# Patient Record
Sex: Female | Born: 1935 | Race: White | Hispanic: No | Smoking: Never smoker
Health system: Southern US, Community
[De-identification: ages and names within clinical notes are randomized; demographics above are authoritative.]

## PROBLEM LIST (undated history)

## (undated) DIAGNOSIS — G510 Bell's palsy: Secondary | ICD-10-CM

## (undated) DIAGNOSIS — G839 Paralytic syndrome, unspecified: Secondary | ICD-10-CM

## (undated) DIAGNOSIS — F419 Anxiety disorder, unspecified: Secondary | ICD-10-CM

## (undated) DIAGNOSIS — I10 Essential (primary) hypertension: Secondary | ICD-10-CM

## (undated) DIAGNOSIS — C8310 Mantle cell lymphoma, unspecified site: Secondary | ICD-10-CM

## (undated) DIAGNOSIS — E785 Hyperlipidemia, unspecified: Secondary | ICD-10-CM

## (undated) DIAGNOSIS — E119 Type 2 diabetes mellitus without complications: Secondary | ICD-10-CM

## (undated) DIAGNOSIS — T7840XA Allergy, unspecified, initial encounter: Secondary | ICD-10-CM

## (undated) DIAGNOSIS — C801 Malignant (primary) neoplasm, unspecified: Secondary | ICD-10-CM

## (undated) HISTORY — PX: APPENDECTOMY: SHX54

## (undated) HISTORY — PX: VESICOVAGINAL FISTULA CLOSURE W/ TAH: SUR271

## (undated) HISTORY — DX: Essential (primary) hypertension: I10

## (undated) HISTORY — PX: ABDOMINAL HYSTERECTOMY: SHX81

## (undated) HISTORY — DX: Allergy, unspecified, initial encounter: T78.40XA

---

## 2004-09-04 ENCOUNTER — Ambulatory Visit: Payer: Self-pay | Admitting: Internal Medicine

## 2012-01-11 ENCOUNTER — Ambulatory Visit: Payer: Self-pay

## 2012-04-25 ENCOUNTER — Ambulatory Visit: Payer: Self-pay | Admitting: Internal Medicine

## 2012-04-25 LAB — RAPID STREP-A WITH REFLX: Micro Text Report: NEGATIVE

## 2012-04-27 LAB — BETA STREP CULTURE(ARMC)

## 2012-07-05 ENCOUNTER — Ambulatory Visit: Payer: Self-pay | Admitting: Family Medicine

## 2012-08-13 ENCOUNTER — Ambulatory Visit: Payer: Self-pay | Admitting: Internal Medicine

## 2012-12-26 ENCOUNTER — Ambulatory Visit: Payer: Self-pay | Admitting: Family Medicine

## 2012-12-26 LAB — COMPREHENSIVE METABOLIC PANEL
Albumin: 3.8 g/dL (ref 3.4–5.0)
Alkaline Phosphatase: 92 U/L (ref 50–136)
BUN: 14 mg/dL (ref 7–18)
Bilirubin,Total: 0.5 mg/dL (ref 0.2–1.0)
Calcium, Total: 9.4 mg/dL (ref 8.5–10.1)
Chloride: 100 mmol/L (ref 98–107)
Co2: 30 mmol/L (ref 21–32)
Creatinine: 0.76 mg/dL (ref 0.60–1.30)
EGFR (Non-African Amer.): >60
Osmolality: 279 (ref 275–301)
Potassium: 4.7 mmol/L (ref 3.5–5.1)
SGOT(AST): 19 U/L (ref 15–37)

## 2012-12-26 LAB — CBC WITH DIFFERENTIAL/PLATELET
Eosinophil %: 0.5 %
HCT: 39.8 % (ref 35.0–47.0)
HGB: 13.4 g/dL (ref 12.0–16.0)
Lymphocyte #: 1.4 10*3/uL (ref 1.0–3.6)
Lymphocyte %: 13.7 %
MCHC: 33.8 g/dL (ref 32.0–36.0)
MCV: 90 fL (ref 80–100)
Monocyte #: 0.3 x10 3/mm (ref 0.2–0.9)
Neutrophil %: 82 %
Platelet: 203 10*3/uL (ref 150–440)
RBC: 4.44 10*6/uL (ref 3.80–5.20)
RDW: 12.6 % (ref 11.5–14.5)
WBC: 9.9 10*3/uL (ref 3.6–11.0)

## 2012-12-26 LAB — URINALYSIS, COMPLETE
Leukocyte Esterase: NEGATIVE
Ph: 7 (ref 4.5–8.0)
Protein: NEGATIVE
Specific Gravity: 1.005 (ref 1.003–1.030)
Squamous Epithelial: NONE SEEN

## 2012-12-26 LAB — TSH: Thyroid Stimulating Horm: 1.39 u[IU]/mL

## 2012-12-26 LAB — RAPID STREP-A WITH REFLX: Micro Text Report: POSITIVE

## 2013-07-19 ENCOUNTER — Ambulatory Visit: Payer: Self-pay | Admitting: Internal Medicine

## 2013-07-19 LAB — RAPID STREP-A WITH REFLX: Micro Text Report: NEGATIVE

## 2013-07-21 LAB — BETA STREP CULTURE(ARMC)

## 2013-12-17 ENCOUNTER — Ambulatory Visit: Payer: Self-pay

## 2013-12-17 LAB — RAPID STREP-A WITH REFLX: Micro Text Report: NEGATIVE

## 2013-12-20 LAB — BETA STREP CULTURE(ARMC)

## 2014-01-10 DIAGNOSIS — F419 Anxiety disorder, unspecified: Secondary | ICD-10-CM | POA: Insufficient documentation

## 2014-02-20 ENCOUNTER — Ambulatory Visit: Payer: Self-pay | Admitting: Family Medicine

## 2014-02-26 ENCOUNTER — Ambulatory Visit: Payer: Self-pay | Admitting: Family Medicine

## 2014-02-26 LAB — BASIC METABOLIC PANEL
Anion Gap: 7 (ref 7–16)
BUN: 23 mg/dL — ABNORMAL HIGH (ref 7–18)
CALCIUM: 9.6 mg/dL (ref 8.5–10.1)
CO2: 30 mmol/L (ref 21–32)
CREATININE: 0.88 mg/dL (ref 0.60–1.30)
Chloride: 97 mmol/L — ABNORMAL LOW (ref 98–107)
EGFR (African American): >60
EGFR (Non-African Amer.): >60
GLUCOSE: 154 mg/dL — AB (ref 65–99)
Osmolality: 275 (ref 275–301)
POTASSIUM: 4.3 mmol/L (ref 3.5–5.1)
SODIUM: 134 mmol/L — AB (ref 136–145)

## 2014-02-26 LAB — CBC WITH DIFFERENTIAL/PLATELET
Basophil #: 0.1 x10 3/mm 3 (ref 0.0–0.1)
Basophil %: 0.5 %
Eosinophil #: 0.1 x10 3/mm 3 (ref 0.0–0.7)
Eosinophil %: 1.3 %
HCT: 36.5 % (ref 35.0–47.0)
HGB: 11.9 g/dL — ABNORMAL LOW (ref 12.0–16.0)
Lymphocyte %: 32.7 %
Lymphs Abs: 3.6 x10 3/mm 3 (ref 1.0–3.6)
MCH: 29.4 pg (ref 26.0–34.0)
MCHC: 32.6 g/dL (ref 32.0–36.0)
MCV: 90 fL (ref 80–100)
Monocyte #: 0.7 x10 3/mm (ref 0.2–0.9)
Monocyte %: 6.1 %
Neutrophil #: 6.5 x10 3/mm 3 (ref 1.4–6.5)
Neutrophil %: 59.4 %
Platelet: 248 x10 3/mm 3 (ref 150–440)
RBC: 4.05 X10 6/mm 3 (ref 3.80–5.20)
RDW: 13.8 % (ref 11.5–14.5)
WBC: 10.9 x10 3/mm 3 (ref 3.6–11.0)

## 2014-02-27 ENCOUNTER — Ambulatory Visit: Payer: Self-pay | Admitting: Family Medicine

## 2014-02-27 LAB — TSH: THYROID STIMULATING HORM: 91.8 u[IU]/mL — AB

## 2014-03-01 ENCOUNTER — Ambulatory Visit: Payer: Self-pay | Admitting: Cardiothoracic Surgery

## 2014-03-01 ENCOUNTER — Ambulatory Visit: Payer: Self-pay | Admitting: Radiation Oncology

## 2014-03-01 LAB — HEPATIC FUNCTION PANEL A (ARMC)
ALBUMIN: 2.8 g/dL — AB (ref 3.4–5.0)
Alkaline Phosphatase: 121 U/L — ABNORMAL HIGH
Bilirubin,Total: 0.4 mg/dL (ref 0.2–1.0)
SGOT(AST): 73 U/L — ABNORMAL HIGH (ref 15–37)
SGPT (ALT): 36 U/L
Total Protein: 8 g/dL (ref 6.4–8.2)

## 2014-03-01 LAB — APTT: Activated PTT: 27.9 secs (ref 23.6–35.9)

## 2014-03-01 LAB — PROTIME-INR
INR: 1
PROTHROMBIN TIME: 13 s (ref 11.5–14.7)

## 2014-03-06 ENCOUNTER — Ambulatory Visit: Payer: Self-pay

## 2014-03-12 ENCOUNTER — Ambulatory Visit: Payer: Self-pay

## 2014-03-15 LAB — PATHOLOGY REPORT

## 2014-03-19 ENCOUNTER — Ambulatory Visit: Payer: Self-pay | Admitting: Oncology

## 2014-03-20 ENCOUNTER — Ambulatory Visit: Payer: Self-pay | Admitting: Radiation Oncology

## 2014-03-20 ENCOUNTER — Ambulatory Visit: Payer: Self-pay | Admitting: Oncology

## 2014-03-31 ENCOUNTER — Ambulatory Visit: Payer: Self-pay | Admitting: Internal Medicine

## 2014-04-17 LAB — CBC CANCER CENTER
BASOS ABS: 0 x10 3/mm (ref 0.0–0.1)
Basophil %: 0.3 %
Eosinophil #: 0 x10 3/mm (ref 0.0–0.7)
Eosinophil %: 0.4 %
HCT: 33.2 % — ABNORMAL LOW (ref 35.0–47.0)
HGB: 10.8 g/dL — ABNORMAL LOW (ref 12.0–16.0)
Lymphocyte #: 2.8 x10 3/mm (ref 1.0–3.6)
Lymphocyte %: 22.5 %
MCH: 29 pg (ref 26.0–34.0)
MCHC: 32.7 g/dL (ref 32.0–36.0)
MCV: 89 fL (ref 80–100)
MONO ABS: 0.7 x10 3/mm (ref 0.2–0.9)
Monocyte %: 6 %
NEUTROS ABS: 8.8 x10 3/mm — AB (ref 1.4–6.5)
Neutrophil %: 70.8 %
PLATELETS: 375 x10 3/mm (ref 150–440)
RBC: 3.74 10*6/uL — ABNORMAL LOW (ref 3.80–5.20)
RDW: 14.4 % (ref 11.5–14.5)
WBC: 12.4 x10 3/mm — ABNORMAL HIGH (ref 3.6–11.0)

## 2014-04-19 ENCOUNTER — Ambulatory Visit: Payer: Self-pay | Admitting: Oncology

## 2014-04-19 ENCOUNTER — Ambulatory Visit: Payer: Self-pay | Admitting: Radiation Oncology

## 2014-05-08 LAB — COMPREHENSIVE METABOLIC PANEL
Albumin: 2.3 g/dL — ABNORMAL LOW (ref 3.4–5.0)
Alkaline Phosphatase: 169 U/L — ABNORMAL HIGH
Anion Gap: 7 (ref 7–16)
BILIRUBIN TOTAL: 0.3 mg/dL (ref 0.2–1.0)
BUN: 13 mg/dL (ref 7–18)
CREATININE: 0.84 mg/dL (ref 0.60–1.30)
Calcium, Total: 8.8 mg/dL (ref 8.5–10.1)
Chloride: 99 mmol/L (ref 98–107)
Co2: 29 mmol/L (ref 21–32)
EGFR (African American): >60
EGFR (Non-African Amer.): >60
GLUCOSE: 219 mg/dL — AB (ref 65–99)
OSMOLALITY: 277 (ref 275–301)
POTASSIUM: 3.1 mmol/L — AB (ref 3.5–5.1)
SGOT(AST): 24 U/L (ref 15–37)
SGPT (ALT): 22 U/L
Sodium: 135 mmol/L — ABNORMAL LOW (ref 136–145)
Total Protein: 6.9 g/dL (ref 6.4–8.2)

## 2014-05-08 LAB — CBC CANCER CENTER
BASOS PCT: 0.6 %
Basophil #: 0.1 x10 3/mm (ref 0.0–0.1)
EOS ABS: 0.1 x10 3/mm (ref 0.0–0.7)
Eosinophil %: 0.7 %
HCT: 33.4 % — ABNORMAL LOW (ref 35.0–47.0)
HGB: 10.6 g/dL — AB (ref 12.0–16.0)
LYMPHS PCT: 36.5 %
Lymphocyte #: 4.3 x10 3/mm — ABNORMAL HIGH (ref 1.0–3.6)
MCH: 27.9 pg (ref 26.0–34.0)
MCHC: 31.7 g/dL — ABNORMAL LOW (ref 32.0–36.0)
MCV: 88 fL (ref 80–100)
MONOS PCT: 5.3 %
Monocyte #: 0.6 x10 3/mm (ref 0.2–0.9)
NEUTROS ABS: 6.7 x10 3/mm — AB (ref 1.4–6.5)
NEUTROS PCT: 56.9 %
Platelet: 280 x10 3/mm (ref 150–440)
RBC: 3.81 10*6/uL (ref 3.80–5.20)
RDW: 14 % (ref 11.5–14.5)
WBC: 11.9 x10 3/mm — ABNORMAL HIGH (ref 3.6–11.0)

## 2014-05-17 LAB — CBC CANCER CENTER
Basophil #: 0 x10 3/mm (ref 0.0–0.1)
Basophil %: 0.2 %
Eosinophil #: 0 x10 3/mm (ref 0.0–0.7)
Eosinophil %: 0.8 %
HCT: 32.4 % — ABNORMAL LOW (ref 35.0–47.0)
HGB: 10.5 g/dL — AB (ref 12.0–16.0)
Lymphocyte #: 0.6 x10 3/mm — ABNORMAL LOW (ref 1.0–3.6)
Lymphocyte %: 9.5 %
MCH: 28.2 pg (ref 26.0–34.0)
MCHC: 32.3 g/dL (ref 32.0–36.0)
MCV: 87 fL (ref 80–100)
Monocyte #: 0.5 x10 3/mm (ref 0.2–0.9)
Monocyte %: 8.1 %
Neutrophil #: 5.2 x10 3/mm (ref 1.4–6.5)
Neutrophil %: 81.4 %
Platelet: 415 x10 3/mm (ref 150–440)
RBC: 3.72 10*6/uL — ABNORMAL LOW (ref 3.80–5.20)
RDW: 14.3 % (ref 11.5–14.5)
WBC: 6.4 x10 3/mm (ref 3.6–11.0)

## 2014-05-17 LAB — COMPREHENSIVE METABOLIC PANEL
AST: 15 U/L (ref 15–37)
Albumin: 3 g/dL — ABNORMAL LOW (ref 3.4–5.0)
Alkaline Phosphatase: 156 U/L — ABNORMAL HIGH
Anion Gap: 10 (ref 7–16)
BILIRUBIN TOTAL: 0.4 mg/dL (ref 0.2–1.0)
BUN: 10 mg/dL (ref 7–18)
CALCIUM: 9.3 mg/dL (ref 8.5–10.1)
CHLORIDE: 94 mmol/L — AB (ref 98–107)
Co2: 27 mmol/L (ref 21–32)
Creatinine: 0.64 mg/dL (ref 0.60–1.30)
EGFR (Non-African Amer.): >60
Glucose: 220 mg/dL — ABNORMAL HIGH (ref 65–99)
Osmolality: 268 (ref 275–301)
Potassium: 3.7 mmol/L (ref 3.5–5.1)
SGPT (ALT): 21 U/L
SODIUM: 131 mmol/L — AB (ref 136–145)
TOTAL PROTEIN: 7.4 g/dL (ref 6.4–8.2)

## 2014-05-17 LAB — LACTATE DEHYDROGENASE: LDH: 176 U/L (ref 81–246)

## 2014-05-20 ENCOUNTER — Ambulatory Visit: Payer: Self-pay | Admitting: Radiation Oncology

## 2014-05-20 ENCOUNTER — Ambulatory Visit: Payer: Self-pay | Admitting: Oncology

## 2014-05-31 LAB — CBC CANCER CENTER
BASOS ABS: 0 x10 3/mm (ref 0.0–0.1)
Basophil %: 0.6 %
EOS ABS: 0.3 x10 3/mm (ref 0.0–0.7)
Eosinophil %: 5.9 %
HCT: 32.5 % — ABNORMAL LOW (ref 35.0–47.0)
HGB: 10.8 g/dL — AB (ref 12.0–16.0)
LYMPHS PCT: 15.3 %
Lymphocyte #: 0.9 x10 3/mm — ABNORMAL LOW (ref 1.0–3.6)
MCH: 28.4 pg (ref 26.0–34.0)
MCHC: 33.3 g/dL (ref 32.0–36.0)
MCV: 85 fL (ref 80–100)
MONOS PCT: 11.5 %
Monocyte #: 0.7 x10 3/mm (ref 0.2–0.9)
NEUTROS ABS: 3.8 x10 3/mm (ref 1.4–6.5)
Neutrophil %: 66.7 %
Platelet: 234 x10 3/mm (ref 150–440)
RBC: 3.81 10*6/uL (ref 3.80–5.20)
RDW: 15.5 % — ABNORMAL HIGH (ref 11.5–14.5)
WBC: 5.7 x10 3/mm (ref 3.6–11.0)

## 2014-05-31 LAB — COMPREHENSIVE METABOLIC PANEL
ALBUMIN: 3.4 g/dL (ref 3.4–5.0)
ALK PHOS: 141 U/L — AB
ALT: 21 U/L
ANION GAP: 11 (ref 7–16)
BUN: 19 mg/dL — AB (ref 7–18)
Bilirubin,Total: 0.3 mg/dL (ref 0.2–1.0)
CHLORIDE: 97 mmol/L — AB (ref 98–107)
Calcium, Total: 9.6 mg/dL (ref 8.5–10.1)
Co2: 28 mmol/L (ref 21–32)
Creatinine: 0.63 mg/dL (ref 0.60–1.30)
EGFR (African American): >60
Glucose: 146 mg/dL — ABNORMAL HIGH (ref 65–99)
Osmolality: 277 (ref 275–301)
Potassium: 4.3 mmol/L (ref 3.5–5.1)
SGOT(AST): 19 U/L (ref 15–37)
Sodium: 136 mmol/L (ref 136–145)
Total Protein: 7.9 g/dL (ref 6.4–8.2)

## 2014-05-31 LAB — LACTATE DEHYDROGENASE: LDH: 169 U/L (ref 81–246)

## 2014-06-07 LAB — CBC CANCER CENTER
Basophil #: 0 x10 3/mm (ref 0.0–0.1)
Basophil %: 0.2 %
Eosinophil #: 0.5 x10 3/mm (ref 0.0–0.7)
Eosinophil %: 9.7 %
HCT: 29.4 % — AB (ref 35.0–47.0)
HGB: 9.6 g/dL — ABNORMAL LOW (ref 12.0–16.0)
Lymphocyte #: 0.2 x10 3/mm — ABNORMAL LOW (ref 1.0–3.6)
Lymphocyte %: 4.8 %
MCH: 28.6 pg (ref 26.0–34.0)
MCHC: 32.6 g/dL (ref 32.0–36.0)
MCV: 88 fL (ref 80–100)
Monocyte #: 0.4 x10 3/mm (ref 0.2–0.9)
Monocyte %: 8.8 %
NEUTROS ABS: 3.9 x10 3/mm (ref 1.4–6.5)
Neutrophil %: 76.5 %
Platelet: 164 x10 3/mm (ref 150–440)
RBC: 3.35 10*6/uL — ABNORMAL LOW (ref 3.80–5.20)
RDW: 15.4 % — ABNORMAL HIGH (ref 11.5–14.5)
WBC: 5.1 x10 3/mm (ref 3.6–11.0)

## 2014-06-19 ENCOUNTER — Ambulatory Visit: Payer: Self-pay | Admitting: Oncology

## 2014-06-21 LAB — CBC CANCER CENTER
BASOS ABS: 0 x10 3/mm (ref 0.0–0.1)
Basophil %: 0.4 %
EOS ABS: 1.1 x10 3/mm — AB (ref 0.0–0.7)
EOS PCT: 25.5 %
HCT: 31.1 % — ABNORMAL LOW (ref 35.0–47.0)
HGB: 10.6 g/dL — AB (ref 12.0–16.0)
Lymphocyte #: 0.3 x10 3/mm — ABNORMAL LOW (ref 1.0–3.6)
Lymphocyte %: 7.5 %
MCH: 29.1 pg (ref 26.0–34.0)
MCHC: 34 g/dL (ref 32.0–36.0)
MCV: 86 fL (ref 80–100)
Monocyte #: 0.5 x10 3/mm (ref 0.2–0.9)
Monocyte %: 11.3 %
NEUTROS ABS: 2.3 x10 3/mm (ref 1.4–6.5)
Neutrophil %: 55.3 %
PLATELETS: 251 x10 3/mm (ref 150–440)
RBC: 3.63 10*6/uL — AB (ref 3.80–5.20)
RDW: 17.1 % — ABNORMAL HIGH (ref 11.5–14.5)
WBC: 4.2 x10 3/mm (ref 3.6–11.0)

## 2014-06-21 LAB — COMPREHENSIVE METABOLIC PANEL
ALT: 20 U/L
Albumin: 3.5 g/dL (ref 3.4–5.0)
Alkaline Phosphatase: 141 U/L — ABNORMAL HIGH
Anion Gap: 8 (ref 7–16)
BUN: 14 mg/dL (ref 7–18)
Bilirubin,Total: 0.3 mg/dL (ref 0.2–1.0)
CALCIUM: 9.7 mg/dL (ref 8.5–10.1)
CHLORIDE: 98 mmol/L (ref 98–107)
Co2: 31 mmol/L (ref 21–32)
Creatinine: 0.69 mg/dL (ref 0.60–1.30)
EGFR (African American): >60
EGFR (Non-African Amer.): >60
GLUCOSE: 145 mg/dL — AB (ref 65–99)
Osmolality: 277 (ref 275–301)
Potassium: 3.7 mmol/L (ref 3.5–5.1)
SGOT(AST): 15 U/L (ref 15–37)
Sodium: 137 mmol/L (ref 136–145)
Total Protein: 7.5 g/dL (ref 6.4–8.2)

## 2014-06-21 LAB — LACTATE DEHYDROGENASE: LDH: 167 U/L (ref 81–246)

## 2014-06-28 LAB — CBC CANCER CENTER
Basophil #: 0 x10 3/mm (ref 0.0–0.1)
Basophil %: 0.4 %
EOS PCT: 21.2 %
Eosinophil #: 0.6 x10 3/mm (ref 0.0–0.7)
HCT: 29.4 % — AB (ref 35.0–47.0)
HGB: 9.9 g/dL — AB (ref 12.0–16.0)
LYMPHS ABS: 0.3 x10 3/mm — AB (ref 1.0–3.6)
Lymphocyte %: 9.1 %
MCH: 29.1 pg (ref 26.0–34.0)
MCHC: 33.5 g/dL (ref 32.0–36.0)
MCV: 87 fL (ref 80–100)
MONO ABS: 0.5 x10 3/mm (ref 0.2–0.9)
Monocyte %: 15.5 %
NEUTROS PCT: 53.8 %
Neutrophil #: 1.6 x10 3/mm (ref 1.4–6.5)
Platelet: 249 x10 3/mm (ref 150–440)
RBC: 3.39 10*6/uL — AB (ref 3.80–5.20)
RDW: 17.2 % — ABNORMAL HIGH (ref 11.5–14.5)
WBC: 3 x10 3/mm — ABNORMAL LOW (ref 3.6–11.0)

## 2014-07-17 LAB — CBC CANCER CENTER
BASOS ABS: 0 x10 3/mm (ref 0.0–0.1)
BASOS PCT: 0.4 %
Eosinophil #: 0.5 x10 3/mm (ref 0.0–0.7)
Eosinophil %: 12.6 %
HCT: 32.7 % — ABNORMAL LOW (ref 35.0–47.0)
HGB: 10.9 g/dL — AB (ref 12.0–16.0)
LYMPHS PCT: 18.3 %
Lymphocyte #: 0.7 x10 3/mm — ABNORMAL LOW (ref 1.0–3.6)
MCH: 29.1 pg (ref 26.0–34.0)
MCHC: 33.3 g/dL (ref 32.0–36.0)
MCV: 87 fL (ref 80–100)
MONO ABS: 0.8 x10 3/mm (ref 0.2–0.9)
MONOS PCT: 21.3 %
NEUTROS ABS: 1.8 x10 3/mm (ref 1.4–6.5)
Neutrophil %: 47.4 %
PLATELETS: 216 x10 3/mm (ref 150–440)
RBC: 3.75 10*6/uL — AB (ref 3.80–5.20)
RDW: 17.2 % — ABNORMAL HIGH (ref 11.5–14.5)
WBC: 3.8 x10 3/mm (ref 3.6–11.0)

## 2014-07-17 LAB — COMPREHENSIVE METABOLIC PANEL
Albumin: 3.7 g/dL (ref 3.4–5.0)
Alkaline Phosphatase: 130 U/L — ABNORMAL HIGH
Anion Gap: 8 (ref 7–16)
BILIRUBIN TOTAL: 0.2 mg/dL (ref 0.2–1.0)
BUN: 16 mg/dL (ref 7–18)
CHLORIDE: 98 mmol/L (ref 98–107)
CREATININE: 0.79 mg/dL (ref 0.60–1.30)
Calcium, Total: 9.7 mg/dL (ref 8.5–10.1)
Co2: 30 mmol/L (ref 21–32)
EGFR (African American): >60
EGFR (Non-African Amer.): >60
Glucose: 154 mg/dL — ABNORMAL HIGH (ref 65–99)
OSMOLALITY: 276 (ref 275–301)
Potassium: 4.6 mmol/L (ref 3.5–5.1)
SGOT(AST): 17 U/L (ref 15–37)
SGPT (ALT): 20 U/L
SODIUM: 136 mmol/L (ref 136–145)
Total Protein: 7.5 g/dL (ref 6.4–8.2)

## 2014-07-17 LAB — LACTATE DEHYDROGENASE: LDH: 176 U/L (ref 81–246)

## 2014-07-20 ENCOUNTER — Ambulatory Visit: Payer: Self-pay | Admitting: Oncology

## 2014-07-20 ENCOUNTER — Ambulatory Visit: Payer: Self-pay | Admitting: Radiation Oncology

## 2014-07-25 LAB — CBC CANCER CENTER
BASOS ABS: 0 x10 3/mm (ref 0.0–0.1)
BASOS PCT: 0.2 %
Eosinophil #: 0.5 x10 3/mm (ref 0.0–0.7)
Eosinophil %: 8.2 %
HCT: 32.4 % — AB (ref 35.0–47.0)
HGB: 11.2 g/dL — AB (ref 12.0–16.0)
LYMPHS ABS: 0.1 x10 3/mm — AB (ref 1.0–3.6)
LYMPHS PCT: 2.3 %
MCH: 29.9 pg (ref 26.0–34.0)
MCHC: 34.5 g/dL (ref 32.0–36.0)
MCV: 87 fL (ref 80–100)
MONOS PCT: 12.8 %
Monocyte #: 0.7 x10 3/mm (ref 0.2–0.9)
NEUTROS ABS: 4.4 x10 3/mm (ref 1.4–6.5)
Neutrophil %: 76.5 %
Platelet: 204 x10 3/mm (ref 150–440)
RBC: 3.74 10*6/uL — AB (ref 3.80–5.20)
RDW: 16.9 % — ABNORMAL HIGH (ref 11.5–14.5)
WBC: 5.7 x10 3/mm (ref 3.6–11.0)

## 2014-08-06 ENCOUNTER — Ambulatory Visit: Payer: Self-pay | Admitting: Oncology

## 2014-08-14 LAB — CBC CANCER CENTER
BASOS ABS: 0 x10 3/mm (ref 0.0–0.1)
Basophil %: 0.4 %
EOS ABS: 0.3 x10 3/mm (ref 0.0–0.7)
Eosinophil %: 9.1 %
HCT: 33 % — ABNORMAL LOW (ref 35.0–47.0)
HGB: 11.4 g/dL — ABNORMAL LOW (ref 12.0–16.0)
LYMPHS PCT: 13.6 %
Lymphocyte #: 0.5 x10 3/mm — ABNORMAL LOW (ref 1.0–3.6)
MCH: 30.5 pg (ref 26.0–34.0)
MCHC: 34.7 g/dL (ref 32.0–36.0)
MCV: 88 fL (ref 80–100)
MONO ABS: 0.5 x10 3/mm (ref 0.2–0.9)
Monocyte %: 14.7 %
NEUTROS ABS: 2.2 x10 3/mm (ref 1.4–6.5)
Neutrophil %: 62.2 %
Platelet: 211 x10 3/mm (ref 150–440)
RBC: 3.75 10*6/uL — ABNORMAL LOW (ref 3.80–5.20)
RDW: 16.3 % — AB (ref 11.5–14.5)
WBC: 3.5 x10 3/mm — AB (ref 3.6–11.0)

## 2014-08-14 LAB — COMPREHENSIVE METABOLIC PANEL
ALT: 23 U/L (ref 14–63)
ANION GAP: 9 (ref 7–16)
Albumin: 3.8 g/dL (ref 3.4–5.0)
Alkaline Phosphatase: 131 U/L — ABNORMAL HIGH (ref 46–116)
BUN: 27 mg/dL — ABNORMAL HIGH (ref 7–18)
Bilirubin,Total: 0.3 mg/dL (ref 0.2–1.0)
CHLORIDE: 99 mmol/L (ref 98–107)
CO2: 30 mmol/L (ref 21–32)
Calcium, Total: 9.4 mg/dL (ref 8.5–10.1)
Creatinine: 0.83 mg/dL (ref 0.60–1.30)
EGFR (African American): >60
EGFR (Non-African Amer.): >60
GLUCOSE: 123 mg/dL — AB (ref 65–99)
Osmolality: 282 (ref 275–301)
POTASSIUM: 3.9 mmol/L (ref 3.5–5.1)
SGOT(AST): 19 U/L (ref 15–37)
Sodium: 138 mmol/L (ref 136–145)
TOTAL PROTEIN: 7.5 g/dL (ref 6.4–8.2)

## 2014-08-14 LAB — LACTATE DEHYDROGENASE: LDH: 180 U/L (ref 81–246)

## 2014-08-20 ENCOUNTER — Ambulatory Visit: Payer: Self-pay | Admitting: Oncology

## 2014-08-20 ENCOUNTER — Ambulatory Visit: Payer: Self-pay | Admitting: Radiation Oncology

## 2014-09-18 ENCOUNTER — Ambulatory Visit
Admit: 2014-09-18 | Disposition: A | Discharge status: Home / Self Care | Payer: Self-pay | Attending: Oncology | Admitting: Oncology

## 2014-10-15 ENCOUNTER — Ambulatory Visit: Admit: 2014-10-15 | Disposition: A | Discharge status: Home / Self Care | Payer: Self-pay | Admitting: Oncology

## 2014-10-17 LAB — CBC CANCER CENTER
BASOS PCT: 0.3 %
Basophil #: 0 x10 3/mm (ref 0.0–0.1)
Eosinophil #: 0.4 x10 3/mm (ref 0.0–0.7)
Eosinophil %: 12 %
HCT: 31.1 % — AB (ref 35.0–47.0)
HGB: 10.8 g/dL — AB (ref 12.0–16.0)
Lymphocyte #: 0.3 x10 3/mm — ABNORMAL LOW (ref 1.0–3.6)
Lymphocyte %: 7.9 %
MCH: 32.1 pg (ref 26.0–34.0)
MCHC: 34.9 g/dL (ref 32.0–36.0)
MCV: 92 fL (ref 80–100)
Monocyte #: 0.4 x10 3/mm (ref 0.2–0.9)
Monocyte %: 13 %
Neutrophil #: 2.2 x10 3/mm (ref 1.4–6.5)
Neutrophil %: 66.8 %
Platelet: 210 x10 3/mm (ref 150–440)
RBC: 3.37 10*6/uL — ABNORMAL LOW (ref 3.80–5.20)
RDW: 14.4 % (ref 11.5–14.5)
WBC: 3.3 x10 3/mm — ABNORMAL LOW (ref 3.6–11.0)

## 2014-10-17 LAB — COMPREHENSIVE METABOLIC PANEL
ALBUMIN: 4.4 g/dL
ALK PHOS: 99 U/L
ALT: 15 U/L
AST: 25 U/L
Anion Gap: 9 (ref 7–16)
BUN: 16 mg/dL
Bilirubin,Total: 0.3 mg/dL
CALCIUM: 9.8 mg/dL
CO2: 28 mmol/L
Chloride: 101 mmol/L
Creatinine: 0.56 mg/dL
EGFR (African American): >60
Glucose: 167 mg/dL — ABNORMAL HIGH
Potassium: 3.8 mmol/L
Sodium: 138 mmol/L
TOTAL PROTEIN: 7.1 g/dL

## 2014-10-17 LAB — LACTATE DEHYDROGENASE: LDH: 163 U/L

## 2014-10-19 ENCOUNTER — Ambulatory Visit
Admit: 2014-10-19 | Disposition: A | Discharge status: Home / Self Care | Payer: Self-pay | Attending: Oncology | Admitting: Oncology

## 2014-11-10 NOTE — Op Note (Signed)
PATIENT NAME:  Anna Leonard, Anna Leonard MR#:  [id number] DATE OF BIRTH:  Oct 08, 1935  DATE OF PROCEDURE:  03/12/2014  SURGEON: Christia Reading E. Genevive Bi, MD   ASSISTANT: Jeannette How. Marina Gravel, MD   PREOPERATIVE DIAGNOSIS: Mediastinal mass.   POSTOPERATIVE DIAGNOSIS: Mediastinal mass.  OPERATION PERFORMED: 1.  Incisional biopsy of left neck mass.  2.  Multiple attempts at insertion of left cephalic vein cutdown Port-A-Cath.   INDICATIONS FOR PROCEDURE: Anna Leonard is a 79 year old woman who presented with a large mediastinal mass and a CT scan showing extensive lymphadenopathy in the neck and in the chest. This was felt to be most consistent with lymphoma. She was seen by myself and Dr. Delight Hoh, who recommended that she undergo a biopsy and Port-A-Cath insertion. The indications and risks were explained to the patient, who gave her informed consent.   DESCRIPTION OF PROCEDURE: The patient was brought to the operating suite and placed in the supine position. An attempt was made at laryngeal mask anesthesia. However, the mask would not fit properly and laryngoscopy confirmed the presence of what appeared to be tonsillar lesions consistent with lymphoma. Dr. Kathyrn Sheriff was asked to look at the patient, and he agreed and placed an endotracheal tube through the cords under direct visualization using the video laryngoscope. Once this was complete, we then prepped and draped the patient in the usual sterile fashion. A small skin incision was made over the more dominant mass on the left side of her neck. The platysma muscle was divided. Some of the sternocleidomastoid was divided and we could palpate the large mass, which measured at least 2.5 cm. The soft tissue surrounding this was incised, and once the mass was exposed, generous portions were taken and sent for frozen section. This confirmed a lymphoid process. Additional samples were taken for flow cytometry. The neck wound was then irrigated and hemostasis was complete. This was  packed and we then turned our attention to identifying the cephalic vein on the left. This was chosen because of the extensive lymphadenopathy in the neck and mediastinum. An incision was made in the deltopectoral groove on the left and carried down until the vein was identified. The vein was reasonably small but would certainly accommodate the catheter. The vein was doubly encircled with 3-0 silk. A small venotomy was then created and the catheter was trimmed and passed in the cephalic vein upwards. The catheter itself would not thread into the axillary and subclavian vein, and we therefore performed a venogram. This revealed that the cephalic vein was somewhat small at its junction with the subclavian, and that flow in the subclavian was very slow. We attempted to then pass a wire through the vein into the subclavian, but the wire would not pass, either. At this point, we elected to abandon the procedure, as it did not appear to be possible to place a catheter in the upper extremities or in the neck. The wounds were then irrigated again and closed. The cephalic vein was doubly ligated both proximally and distally. Hemostasis was complete. All the wounds were then closed with Vicryl on the deeper structures and nylon for the skin; 4-0 nylon was used on the chest wall and 5-0 nylon in the neck. At this point, the patient was awakened from general endotracheal anesthesia. However, she had significant problems with laryngospasm or upper airway edema, and for this reason she spent a longer period of time in the operating room with BiPAP. She was taken to the recovery room on BiPAP with  stable oxygenation, blood pressure, and heart rate.   ____________________________ Lew Dawes. Genevive Bi, MD teo:ST D: 03/12/2014 15:20:55 ET T: 03/13/2014 02:33:47 ET JOB#: [id number]  cc: Christia Reading E. Genevive Bi, MD, <Dictator> Louis Matte MD ELECTRONICALLY SIGNED 04/02/2014 8:04

## 2015-01-14 ENCOUNTER — Other Ambulatory Visit: Payer: Self-pay | Admitting: *Deleted

## 2015-01-14 DIAGNOSIS — C8310 Mantle cell lymphoma, unspecified site: Secondary | ICD-10-CM

## 2015-01-17 ENCOUNTER — Inpatient Hospital Stay (HOSPITAL_BASED_OUTPATIENT_CLINIC_OR_DEPARTMENT_OTHER): Payer: Commercial Managed Care - HMO

## 2015-01-17 ENCOUNTER — Inpatient Hospital Stay: Payer: Commercial Managed Care - HMO | Attending: Oncology | Admitting: Oncology

## 2015-01-17 ENCOUNTER — Encounter: Payer: Self-pay | Admitting: Oncology

## 2015-01-17 VITALS — BP 142/78 | HR 101 | Temp 98.2°F | Resp 18 | Ht 62.0 in | Wt 142.6 lb

## 2015-01-17 DIAGNOSIS — E119 Type 2 diabetes mellitus without complications: Secondary | ICD-10-CM | POA: Diagnosis not present

## 2015-01-17 DIAGNOSIS — K5900 Constipation, unspecified: Secondary | ICD-10-CM | POA: Insufficient documentation

## 2015-01-17 DIAGNOSIS — I10 Essential (primary) hypertension: Secondary | ICD-10-CM | POA: Diagnosis not present

## 2015-01-17 DIAGNOSIS — C8310 Mantle cell lymphoma, unspecified site: Secondary | ICD-10-CM | POA: Diagnosis not present

## 2015-01-17 DIAGNOSIS — E785 Hyperlipidemia, unspecified: Secondary | ICD-10-CM | POA: Insufficient documentation

## 2015-01-17 DIAGNOSIS — Z79899 Other long term (current) drug therapy: Secondary | ICD-10-CM | POA: Insufficient documentation

## 2015-01-17 LAB — CBC WITH DIFFERENTIAL/PLATELET
Basophils Absolute: 0 10*3/uL (ref 0–0.1)
Basophils Relative: 1 %
Eosinophils Absolute: 0.2 10*3/uL (ref 0–0.7)
Eosinophils Relative: 13 %
HCT: 34.1 % — ABNORMAL LOW (ref 35.0–47.0)
Hemoglobin: 11.4 g/dL — ABNORMAL LOW (ref 12.0–16.0)
LYMPHS ABS: 0.3 10*3/uL — AB (ref 1.0–3.6)
Lymphocytes Relative: 17 %
MCH: 30.5 pg (ref 26.0–34.0)
MCHC: 33.4 g/dL (ref 32.0–36.0)
MCV: 91.4 fL (ref 80.0–100.0)
Monocytes Absolute: 0.4 10*3/uL (ref 0.2–0.9)
Neutro Abs: 0.9 10*3/uL — ABNORMAL LOW (ref 1.4–6.5)
Neutrophils Relative %: 49 %
Platelets: 267 10*3/uL (ref 150–440)
RBC: 3.73 MIL/uL — ABNORMAL LOW (ref 3.80–5.20)
RDW: 12.7 % (ref 11.5–14.5)
WBC: 1.8 10*3/uL — ABNORMAL LOW (ref 3.6–11.0)

## 2015-01-17 LAB — LACTATE DEHYDROGENASE: LDH: 211 U/L — AB (ref 98–192)

## 2015-01-27 ENCOUNTER — Inpatient Hospital Stay
Admission: EM | Admit: 2015-01-27 | Discharge: 2015-01-28 | DRG: 074 | Disposition: A | Discharge status: Home / Self Care | Payer: Commercial Managed Care - HMO | Attending: Internal Medicine | Admitting: Internal Medicine

## 2015-01-27 ENCOUNTER — Emergency Department: Payer: Commercial Managed Care - HMO

## 2015-01-27 DIAGNOSIS — Z87820 Personal history of traumatic brain injury: Secondary | ICD-10-CM | POA: Diagnosis not present

## 2015-01-27 DIAGNOSIS — I639 Cerebral infarction, unspecified: Secondary | ICD-10-CM | POA: Diagnosis present

## 2015-01-27 DIAGNOSIS — G510 Bell's palsy: Secondary | ICD-10-CM | POA: Diagnosis present

## 2015-01-27 DIAGNOSIS — R0682 Tachypnea, not elsewhere classified: Secondary | ICD-10-CM | POA: Diagnosis present

## 2015-01-27 DIAGNOSIS — R000 Tachycardia, unspecified: Secondary | ICD-10-CM | POA: Diagnosis present

## 2015-01-27 DIAGNOSIS — Z8043 Family history of malignant neoplasm of testis: Secondary | ICD-10-CM | POA: Diagnosis not present

## 2015-01-27 DIAGNOSIS — F419 Anxiety disorder, unspecified: Secondary | ICD-10-CM | POA: Diagnosis present

## 2015-01-27 DIAGNOSIS — R6510 Systemic inflammatory response syndrome (SIRS) of non-infectious origin without acute organ dysfunction: Secondary | ICD-10-CM | POA: Diagnosis present

## 2015-01-27 DIAGNOSIS — Z7982 Long term (current) use of aspirin: Secondary | ICD-10-CM

## 2015-01-27 DIAGNOSIS — Z8249 Family history of ischemic heart disease and other diseases of the circulatory system: Secondary | ICD-10-CM

## 2015-01-27 DIAGNOSIS — Z833 Family history of diabetes mellitus: Secondary | ICD-10-CM

## 2015-01-27 DIAGNOSIS — E119 Type 2 diabetes mellitus without complications: Secondary | ICD-10-CM | POA: Diagnosis present

## 2015-01-27 DIAGNOSIS — Z803 Family history of malignant neoplasm of breast: Secondary | ICD-10-CM

## 2015-01-27 DIAGNOSIS — F329 Major depressive disorder, single episode, unspecified: Secondary | ICD-10-CM | POA: Diagnosis present

## 2015-01-27 DIAGNOSIS — E785 Hyperlipidemia, unspecified: Secondary | ICD-10-CM | POA: Diagnosis present

## 2015-01-27 DIAGNOSIS — Z79899 Other long term (current) drug therapy: Secondary | ICD-10-CM | POA: Diagnosis not present

## 2015-01-27 DIAGNOSIS — R531 Weakness: Secondary | ICD-10-CM | POA: Diagnosis present

## 2015-01-27 DIAGNOSIS — S069XAA Unspecified intracranial injury with loss of consciousness status unknown, initial encounter: Secondary | ICD-10-CM | POA: Diagnosis present

## 2015-01-27 DIAGNOSIS — E860 Dehydration: Secondary | ICD-10-CM | POA: Diagnosis present

## 2015-01-27 DIAGNOSIS — S069X9A Unspecified intracranial injury with loss of consciousness of unspecified duration, initial encounter: Secondary | ICD-10-CM | POA: Diagnosis present

## 2015-01-27 DIAGNOSIS — I10 Essential (primary) hypertension: Secondary | ICD-10-CM | POA: Diagnosis present

## 2015-01-27 HISTORY — DX: Paralytic syndrome, unspecified: G83.9

## 2015-01-27 HISTORY — DX: Hyperlipidemia, unspecified: E78.5

## 2015-01-27 HISTORY — DX: Type 2 diabetes mellitus without complications: E11.9

## 2015-01-27 HISTORY — DX: Anxiety disorder, unspecified: F41.9

## 2015-01-27 LAB — CBC
HCT: 34.6 % — ABNORMAL LOW (ref 35.0–47.0)
HEMOGLOBIN: 12.1 g/dL (ref 12.0–16.0)
MCH: 32.1 pg (ref 26.0–34.0)
MCHC: 34.9 g/dL (ref 32.0–36.0)
MCV: 92.1 fL (ref 80.0–100.0)
PLATELETS: 267 10*3/uL (ref 150–440)
RBC: 3.75 MIL/uL — ABNORMAL LOW (ref 3.80–5.20)
RDW: 13.4 % (ref 11.5–14.5)
WBC: 3.7 10*3/uL (ref 3.6–11.0)

## 2015-01-27 LAB — URINALYSIS COMPLETE WITH MICROSCOPIC (ARMC ONLY)
BACTERIA UA: NONE SEEN
Bilirubin Urine: NEGATIVE
Glucose, UA: >500 mg/dL — AB
Hgb urine dipstick: NEGATIVE
Ketones, ur: NEGATIVE mg/dL
Leukocytes, UA: NEGATIVE
Nitrite: NEGATIVE
PH: 6 (ref 5.0–8.0)
Protein, ur: NEGATIVE mg/dL
SPECIFIC GRAVITY, URINE: 1.013 (ref 1.005–1.030)

## 2015-01-27 LAB — TROPONIN I

## 2015-01-27 LAB — BASIC METABOLIC PANEL
Anion gap: 11 (ref 5–15)
BUN: 17 mg/dL (ref 6–20)
CHLORIDE: 96 mmol/L — AB (ref 101–111)
CO2: 26 mmol/L (ref 22–32)
CREATININE: 0.71 mg/dL (ref 0.44–1.00)
Calcium: 9.7 mg/dL (ref 8.9–10.3)
GFR calc Af Amer: >60 mL/min (ref >60)
GFR calc non Af Amer: >60 mL/min (ref >60)
GLUCOSE: 221 mg/dL — AB (ref 65–99)
POTASSIUM: 3.6 mmol/L (ref 3.5–5.1)
Sodium: 133 mmol/L — ABNORMAL LOW (ref 135–145)

## 2015-01-27 LAB — LACTIC ACID, PLASMA: Lactic Acid, Venous: 2.3 mmol/L (ref 0.5–2.0)

## 2015-01-27 LAB — HEPATIC FUNCTION PANEL
ALBUMIN: 4.2 g/dL (ref 3.5–5.0)
ALT: 14 U/L (ref 14–54)
AST: 26 U/L (ref 15–41)
Alkaline Phosphatase: 113 U/L (ref 38–126)
Total Bilirubin: 0.4 mg/dL (ref 0.3–1.2)
Total Protein: 7.9 g/dL (ref 6.5–8.1)

## 2015-01-27 MED ORDER — SODIUM CHLORIDE 0.9 % IV BOLUS (SEPSIS)
1000.0000 mL | Freq: Once | INTRAVENOUS | Status: AC
  Administered 2015-01-27: 1000 mL via INTRAVENOUS

## 2015-01-27 MED ORDER — PIPERACILLIN-TAZOBACTAM 3.375 G IVPB
INTRAVENOUS | Status: AC
  Filled 2015-01-27: qty 50

## 2015-01-27 MED ORDER — PIPERACILLIN-TAZOBACTAM 3.375 G IVPB
3.3750 g | Freq: Once | INTRAVENOUS | Status: AC
  Administered 2015-01-27: 3.375 g via INTRAVENOUS

## 2015-01-27 MED ORDER — SODIUM CHLORIDE 0.9 % IV BOLUS (SEPSIS)
1000.0000 mL | Freq: Once | INTRAVENOUS | Status: AC
Start: 2015-01-27 — End: 2015-01-27
  Administered 2015-01-27: 1000 mL via INTRAVENOUS

## 2015-01-27 NOTE — ED Notes (Signed)
Patient presents to the ED with left sided facial droop and left sided weakness x 1 week.  Patient has slightly slurred speech.  Patient states she thought her mouth was drooping because of her new lipstick.  Patient states she went to the doctor on Wednesday but he didn't notice.  Patient has marked weakness in her left grip.

## 2015-01-27 NOTE — H&P (Signed)
[facility] [place] NAME: Anna Leonard    MR#:  [id number]  DATE OF BIRTH:  1936-02-23  DATE OF ADMISSION:  01/27/2015  PRIMARY CARE PHYSICIAN: Sofie Hartigan, MD   REQUESTING/REFERRING PHYSICIAN: Darrick Penna  CHIEF COMPLAINT:   Chief Complaint  Patient presents with  . Weakness   left-sided facial droop  HISTORY OF PRESENT ILLNESS:  Anna Leonard  is a 79 y.o. 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 According to the patient's daughter who is with the patient this time she went to her mom's house this evening to take her to dinner and noticed left facial droop. Patient not aware since how long she has been having facial droop. According to the patient's son who is also with the patient at this time and who lives with his mom, he noticed left facial asymmetry 4 days ago but since she did not complain he did not bother to attend on it. Patient admits having some left-sided facial numbness and tingling for the past 1 week. Denies any focal weakness or numbness, no speech or swallowing difficulties, no visual disturbances, no bladder or bowel disturbances, no gait abnormalities. In the emergency room patient was evaluated by the ED physician and noted to have left facial droop. She was also noted to be mildly tachycardic with heart rate of 114 bpm and tachypnea with respiratory rate of 22/m. She denies any history of fever, illness, URI symptoms, cough, chest pain, shortness of breath, palpitations, dizziness, loss of consciousness. Denies any nausea, vomiting, diarrhea, dysuria, abdominal pain. Workup revealed CT head significant for right vital encephalomalacia with overlying craniotomy but negative  for any acute CNS pathology. Chest x-ray unremarkable for any acute cardiopulmonary pathology. EKG sinus tachycardia with ventricular rate of 119 bpm, no acute ischemic changes. Lab work normal CBC elevated blood sugar of 221 but otherwise normal CMP, elevated lactic acid of 2.3. Because of tachycardia and tachypnea, meeting 2 out of 4 sirs criteria, ED physician who opined SIRS with possible sepsis, blood cultures were done and patient was given IV Zosyn. As noted earlier patient denies any history of any recent fever or cough or illnesses.  PAST MEDICAL HISTORY:   Past Medical History  Diagnosis Date  . Hypertension   . Allergy   . Type 2 diabetes mellitus   . HLD (hyperlipidemia)   . Anxiety   . Paralysis     left arm    PAST SURGICAL HISTORY:   Past Surgical History  Procedure Laterality Date  . Vesicovaginal fistula closure w/ tah    . Appendectomy      SOCIAL HISTORY:   History  Substance Use Topics  . Smoking status: Never Smoker   . Smokeless tobacco: Never Used  . Alcohol Use: No    FAMILY HISTORY:   Family History  Problem Relation Age of Onset  . Hypertension Mother   . Cancer Mother     Breast  . Cancer Father     Testicular  . Diabetes Sister   . Diabetes Brother     DRUG ALLERGIES:   Allergies  Allergen Reactions  . Amoxicillin-Pot Clavulanate Other (See Comments)    diarrhea    REVIEW OF SYSTEMS:   Review of Systems  Constitutional: Negative for fever, chills and malaise/fatigue.  HENT: Negative for ear pain, hearing loss, nosebleeds, sore throat and tinnitus.   Eyes: Negative for blurred vision, double vision, pain, discharge and redness.  Respiratory: Negative for cough, hemoptysis, sputum production, shortness of breath and wheezing.   Cardiovascular: Negative for chest pain, palpitations, orthopnea and leg swelling.  Gastrointestinal: Negative for nausea, vomiting, abdominal pain, diarrhea, constipation, blood in stool and melena.   Genitourinary: Negative for dysuria, urgency, frequency and hematuria.  Musculoskeletal: Negative for back pain, joint pain and neck pain.  Skin: Negative for itching and rash.  Neurological: Positive for sensory change. Negative for dizziness, tingling, focal weakness and seizures.       Left facial asymmetry as noted in history of present illness. Tingling of the left side of face of one week duration +.  Endo/Heme/Allergies: Does not bruise/bleed easily.  Psychiatric/Behavioral: Negative for depression. The patient is not nervous/anxious.     MEDICATIONS AT HOME:   Prior to Admission medications   Medication Sig Start Date End Date Taking? Authorizing Provider  aspirin EC 81 MG tablet Take by mouth.   Yes Historical Provider, MD  lisinopril-hydrochlorothiazide (PRINZIDE,ZESTORETIC) 20-25 MG per tablet TAKE ONE TABLET BY MOUTH ONCE DAILY 12/13/14  Yes Historical Provider, MD  loratadine (CLARITIN) 10 MG tablet Take by mouth.   Yes Historical Provider, MD  lovastatin (MEVACOR) 40 MG tablet Take by mouth. 01/17/14  Yes Historical Provider, MD  metFORMIN (GLUCOPHAGE) 500 MG tablet Take by mouth. 01/10/14  Yes Historical Provider, MD  perphenazine-amitriptyline (ETRAFON/TRIAVIL) 2-25 MG TABS Take by mouth. 07/24/14  Yes Historical Provider, MD      VITAL SIGNS:  Blood pressure 146/74, pulse 110, temperature 99.1 F (37.3 C), temperature source Oral, resp. rate 22, height 5\' 2"  (1.575 m), weight 63.957 kg (141 lb), SpO2 96 %.  PHYSICAL EXAMINATION:  Physical Exam  Constitutional: She is oriented to person, place, and time. She appears well-developed and well-nourished. No distress.  HENT:  Head: Normocephalic and atraumatic.  Right Ear: External ear normal.  Left Ear: External ear normal.  Nose: Nose normal.  Mouth/Throat: Oropharynx is clear and moist. No oropharyngeal exudate.  Eyes: EOM are normal. Pupils are equal, round, and reactive to light. No scleral icterus.  Neck: Normal range  of motion. Neck supple. No JVD present. No thyromegaly present.  Cardiovascular: Normal rate, regular rhythm, normal heart sounds and intact distal pulses.  Exam reveals no friction rub.   No murmur heard. Respiratory: Effort normal and breath sounds normal. No respiratory distress. She has no wheezes. She has no rales. She exhibits no tenderness.  GI: Soft. Bowel sounds are normal. She exhibits no distension and no mass. There is no tenderness. There is no rebound and no guarding.  Musculoskeletal: Normal range of motion. She exhibits no edema.  Lymphadenopathy:    She has no cervical adenopathy.  Neurological: She is alert and oriented to person, place, and time. She displays normal reflexes. She exhibits normal muscle tone.  Left-sided facial asymmetry +. Weakness of left UPPER extremity which is chronic.  Skin: Skin is warm. No rash noted. No erythema.  Psychiatric: She has a normal mood and affect. Her behavior is normal. Thought content normal.   LABORATORY PANEL:   CBC  Recent Labs Lab 01/27/15 1815  WBC 3.7  HGB 12.1  HCT 34.6*  PLT 267   ------------------------------------------------------------------------------------------------------------------  Chemistries   Recent Labs Lab 01/27/15 1815  NA 133*  K 3.6  CL 96*  CO2 26  GLUCOSE 221*  BUN 17  CREATININE 0.71  CALCIUM 9.7  AST 26  ALT 14  ALKPHOS 113  BILITOT 0.4   ------------------------------------------------------------------------------------------------------------------  Cardiac Enzymes  Recent Labs Lab 01/27/15 1815  TROPONINI <0.03   ------------------------------------------------------------------------------------------------------------------  RADIOLOGY:  Dg Chest 2 View  01/27/2015   CLINICAL DATA:  Facial droop for 1 week.  History of lymphoma.  EXAM: CHEST  2 VIEW  COMPARISON:  Chest CT 02/27/2014  FINDINGS: The cardiac silhouette, mediastinal and hilar contours are normal. The  lungs are clear. Complete resolution of mediastinal and hilar lymphadenopathy. Left apical density appears to be ectatic vasculature based on the prior CT scan. Remote rib fractures are noted on the left.  IMPRESSION: No acute cardiopulmonary findings. Resolution of bulky mediastinal and hilar lymphadenopathy.   Electronically Signed   By: Marijo Sanes M.D.   On: 01/27/2015 18:57   Ct Head Wo Contrast  01/27/2015   CLINICAL DATA:  Left-sided facial droop and weakness. Slurred speech.  EXAM: CT HEAD WITHOUT CONTRAST  TECHNIQUE: Contiguous axial images were obtained from the base of the skull through the vertex without intravenous contrast.  COMPARISON:  None.  FINDINGS: Left maxillary sinusitis is noted. Status post right parietal craniotomy. Right parietal encephalomalacia is noted consistent with prior surgical resection. No mass effect or midline shift is noted. Ventricular size is within normal limits. There is no evidence of mass lesion, hemorrhage or acute infarction.  IMPRESSION: Left maxillary sinusitis.  Right parietal encephalomalacia is noted with overlying craniotomy defect consistent with prior surgical resection. No acute intracranial abnormality seen.   Electronically Signed   By: Marijo Conception, M.D.   On: 01/27/2015 18:32    EKG:   Orders placed or performed during the hospital encounter of 01/27/15  . ED EKG sinus tachycardia with ventricular rate of 11 9 bpm, no acute ST-T changes.   . ED EKG  . ED EKG  . ED EKG    IMPRESSION AND PLAN:   1. CVA with left facial droop, new onset. Symptoms noticed by her son 4 days ago. 2. History of traumatic brain injury in childhood, status post resection/craniotomy with residual left upper exudative weakness. Plan: Admit to telemetry, nothing by mouth pending swallow evaluation, neuro watch, continue aspirin, statin, order echocardiogram, bilateral carotid Dopplers, MRI brain and neurology consultation.  3. Mild tachycardia with  tachypnea-sirs meeting 2 out of 4 criteria. Patient afebrile, W BC normal, mildly elevated lactic acid of 2.3. Patient denies any history of fever or recent illness. Patient received 1 dose of Zosyn in the ED of obtaining blood cultures. Patient comfortably resting in the bed, doubt sepsis. Possible dehydration cause of tachycardia. Plan: Monitor for now, follow-up temperature curve, IV hydration, follow-up CBC, blood cultures. We'll hold further antibiotics for now. 4. Hypertension stable on home medications. BP within normal range. We'll hold antihypertensive medications for now, follow BP monitoring.  5. Diabetes mellitus type 2, on metformin, stable. We will hold metformin for now because of elevated lactic acid, place her on sliding scale insulin, follow up blood sugars. 6. Hyperlipidemia, stable on statin. Continue same. 7. History of anxiety/depression, stable on home medications. Continue same.    All the records are reviewed and case discussed with ED provider. Management plans discussed with the patient, family and they are in agreement.  CODE STATUS: Full code  TOTAL TIME TAKING CARE OF THIS PATIENT: 50 minutes.    Azucena Freed N M.D on 01/27/2015 at 11:55 PM  Between 7am to 6pm - Pager - [phone]  After 6pm go to [email or web address] - password [email or web address] [facility]  [facility]  Office  [phone]  CC: Primary care physician; [facility], Chrissie Noa, MD

## 2015-01-27 NOTE — ED Notes (Signed)
Pt family reports that pt has had facial droop for about a week. Pt's son lives with her and noticed the droop but pt told him she had a sore in her mouth. She also c/o pain in her arm but she thought it was from chemo or arthritis, and her eye has been watering as well. Son did not put all symptoms together. Pt ran into her daughter tonight at [employer], and the daughter noticed the droop immediately and had her come to the ED. Pt states that she has been going about her normal activities, even walking in her neighborhood every day. Pt denies any pain, other than "arthritis" in her arm and leg. Pt has been undergoing treatment for lymphoma. Last treatment was in February.

## 2015-01-27 NOTE — ED Provider Notes (Signed)
[facility] Emergency Department Provider Note  ____________________________________________  Time seen: Approximately 8:10 PM  I have reviewed the triage vital signs and the nursing notes.   HISTORY  Chief Complaint Weakness    HPI Anna Leonard is a 79 y.o. female with history of TBI as a child with residual left upper extremity weakness, diabetes, hyperlipidemia, hypertension who presents for evaluation of left facial droop, slurred speech, onset sometimeearlier this week. Patient reports that she has had numbness of the left face and lip for 4 days however she has not noticed any drooping necessarily. Today when her family found her/took her out to dinner they noted left facial droop and slurred speech. No word finding difficulties. She denies any new numbness or weakness. Current severity is severe. No modifying factors. No recent illness including no cough, sneezing, runny nose, congestion, vomiting or diarrhea, fevers or chills.   Past Medical History  Diagnosis Date  . Hypertension   . Allergy   . Type 2 diabetes mellitus   . HLD (hyperlipidemia)   . Anxiety   . Paralysis     left arm    Patient Active Problem List   Diagnosis Date Noted  . Diabetes mellitus, type 2 01/17/2015  . HLD (hyperlipidemia) 01/17/2015  . Benign essential HTN 01/17/2015  . Anxiety 01/10/2014    Past Surgical History  Procedure Laterality Date  . Vesicovaginal fistula closure w/ tah    . Appendectomy      Current Outpatient Rx  Name  Route  Sig  Dispense  Refill  . aspirin EC 81 MG tablet   Oral   Take by mouth.         Marland Kitchen lisinopril-hydrochlorothiazide (PRINZIDE,ZESTORETIC) 20-25 MG per tablet      TAKE ONE TABLET BY MOUTH ONCE DAILY         . loratadine (CLARITIN) 10 MG tablet   Oral   Take by mouth.         . lovastatin (MEVACOR) 40 MG tablet   Oral   Take by mouth.         . metFORMIN (GLUCOPHAGE) 500 MG tablet   Oral   Take by  mouth.         . perphenazine-amitriptyline (ETRAFON/TRIAVIL) 2-25 MG TABS   Oral   Take by mouth.           Allergies Amoxicillin-pot clavulanate  Family History  Problem Relation Age of Onset  . Hypertension Mother   . Cancer Mother     Breast  . Cancer Father     Testicular  . Diabetes Sister   . Diabetes Brother     Social History History  Substance Use Topics  . Smoking status: Never Smoker   . Smokeless tobacco: Never Used  . Alcohol Use: No    Review of Systems Constitutional: No fever/chills Eyes: No visual changes. ENT: No sore throat. Cardiovascular: Denies chest pain. Respiratory: Denies shortness of breath. Gastrointestinal: No abdominal pain.  No nausea, no vomiting.  No diarrhea.  No constipation. Genitourinary: Negative for dysuria. Musculoskeletal: Negative for back pain. Skin: Negative for rash. Neurological: Negative for headaches, focal weakness or numbness.  10-point ROS otherwise negative.  ____________________________________________   PHYSICAL EXAM:  VITAL SIGNS: ED Triage Vitals  Enc Vitals Group     BP 01/27/15 1809 126/79 mmHg     Pulse Rate 01/27/15 1809 124     Resp 01/27/15 1809 16     Temp 01/27/15 1809 99.1 F (  37.3 C)     Temp Source 01/27/15 1809 Oral     SpO2 01/27/15 1809 100 %     Weight 01/27/15 1809 141 lb (63.957 kg)     Height 01/27/15 1809 5\' 2"  (1.575 m)     Head Cir --      Peak Flow --      Pain Score --      Pain Loc --      Pain Edu? --      Excl. in [place]? --     Constitutional: Alert and oriented. Well appearing and in no acute distress. Eyes: Conjunctivae are normal. PERRL. EOMI. Head: Atraumatic. Nose: No congestion/rhinnorhea. Mouth/Throat: Mucous membranes are moist.  Oropharynx non-erythematous. Neck: No stridor.  Cardiovascular: Tachycardic rate, regular rhythm. Grossly normal heart sounds.  Good peripheral circulation. Respiratory: Normal respiratory effort.  No retractions. Lungs  CTAB. Gastrointestinal: Soft and nontender. No distention. No abdominal bruits. No CVA tenderness. Genitourinary:deferred  Musculoskeletal: No lower extremity tenderness nor edema.  No joint effusions. Neurologic:  Normal language, mild dysarthria. Severe weakness and facial droop involving the entire left face with diminished sensation to light touch. Chronic left upper extremity weakness with mild decreased sensation to light touch secondary to prior TBI. 5 out of 5 strength in bilateral lower extremities, 5 out of 5 strength in the right upper extremity, sensation intact in the right upper extremity, bilateral lower extremity sensation intact.. Skin:  Skin is warm, dry and intact. No rash noted. Psychiatric: Mood and affect are normal. Speech and behavior are normal.  ____________________________________________   LABS (all labs ordered are listed, but only abnormal results are displayed)  Labs Reviewed  CBC - Abnormal; Notable for the following:    RBC 3.75 (*)    HCT 34.6 (*)    All other components within normal limits  BASIC METABOLIC PANEL - Abnormal; Notable for the following:    Sodium 133 (*)    Chloride 96 (*)    Glucose, Bld 221 (*)    All other components within normal limits  URINALYSIS COMPLETEWITH MICROSCOPIC ([facility] ONLY) - Abnormal; Notable for the following:    Color, Urine YELLOW (*)    APPearance CLEAR (*)    Glucose, UA >500 (*)    Squamous Epithelial / LPF 0-5 (*)    All other components within normal limits  LACTIC ACID, PLASMA - Abnormal; Notable for the following:    Lactic Acid, Venous 2.3 (*)    All other components within normal limits  CULTURE, BLOOD (ROUTINE X 2)  CULTURE, BLOOD (ROUTINE X 2)  HEPATIC FUNCTION PANEL  TROPONIN I  LACTIC ACID, PLASMA   ____________________________________________  EKG  ED ECG REPORT I, Joanne Gavel, the attending physician, personally viewed and interpreted this ECG.   Date: 01/27/2015  EKG Time: 18:38   Rate: 119  Rhythm: sinus tachycardia  Axis: normal  Intervals:none  ST&T Change: No acute ST segment elevation.  ____________________________________________  RADIOLOGY  CXR IMPRESSION: No acute cardiopulmonary findings. Resolution of bulky mediastinal and hilar lymphadenopathy.   CT head IMPRESSION: Left maxillary sinusitis.  Right parietal encephalomalacia is noted with overlying craniotomy defect consistent with prior surgical resection. No acute intracranial abnormality seen.  ____________________________________________   PROCEDURES  Procedure(s) performed: None  Critical Care performed: Yes, see critical care note(s). Total critical care time spent 35 minutes.  ____________________________________________   INITIAL IMPRESSION / ASSESSMENT AND PLAN / ED COURSE  Pertinent labs & imaging results that were available during my care  of the patient were reviewed by me and considered in my medical decision making (see chart for details).    ----------------------------------------- 9:16 PM on 01/27/2015 ----------------------------------------- Anna Leonard is a 79 y.o. female with history of TBI as a child with residual left upper extremity weakness, diabetes, hyperlipidemia, hypertension who presents for evaluation of left facial droop, slurred speech, onset sometime earlier this week. On exam, she does have chronic weakness in the left upper extremity secondary to old TBI which correlates with her right parietal encephalomalacia on CT scan. However, she does have new left facial droop, onset unknown but thought to be over the last several days therefore  she is not a candidate for TPA. Additionally, she is tachycardic, intermittently to tachypneic, meeting 2 out of 4 Sirs criteria. Elevated lactate. She received IV vancomycin and Zosyn empirically as well as liberal IV fluids. Discussed with hospitalist for  admission.   ____________________________________________   FINAL CLINICAL IMPRESSION(S) / ED DIAGNOSES  Final diagnoses:  Cerebral infarction due to unspecified mechanism  SIRS (systemic inflammatory response syndrome)      Joanne Gavel, MD 01/27/15 2307

## 2015-01-28 DIAGNOSIS — I639 Cerebral infarction, unspecified: Secondary | ICD-10-CM

## 2015-01-28 LAB — BASIC METABOLIC PANEL
Anion gap: 7 (ref 5–15)
BUN: 15 mg/dL (ref 6–20)
CO2: 24 mmol/L (ref 22–32)
CREATININE: 0.63 mg/dL (ref 0.44–1.00)
Calcium: 8.7 mg/dL — ABNORMAL LOW (ref 8.9–10.3)
Chloride: 107 mmol/L (ref 101–111)
Glucose, Bld: 187 mg/dL — ABNORMAL HIGH (ref 65–99)
POTASSIUM: 3.7 mmol/L (ref 3.5–5.1)
Sodium: 138 mmol/L (ref 135–145)

## 2015-01-28 LAB — GLUCOSE, CAPILLARY
GLUCOSE-CAPILLARY: 151 mg/dL — AB (ref 65–99)
Glucose-Capillary: 158 mg/dL — ABNORMAL HIGH (ref 65–99)
Glucose-Capillary: 126 mg/dL — ABNORMAL HIGH (ref 65–99)
Glucose-Capillary: 199 mg/dL — ABNORMAL HIGH (ref 65–99)

## 2015-01-28 LAB — CBC
HEMATOCRIT: 30.5 % — AB (ref 35.0–47.0)
Hemoglobin: 10.2 g/dL — ABNORMAL LOW (ref 12.0–16.0)
MCH: 30.9 pg (ref 26.0–34.0)
MCHC: 33.5 g/dL (ref 32.0–36.0)
MCV: 92.2 fL (ref 80.0–100.0)
Platelets: 226 10*3/uL (ref 150–440)
RBC: 3.31 MIL/uL — ABNORMAL LOW (ref 3.80–5.20)
RDW: 13 % (ref 11.5–14.5)
WBC: 2.4 10*3/uL — AB (ref 3.6–11.0)

## 2015-01-28 LAB — LACTIC ACID, PLASMA: Lactic Acid, Venous: 1.2 mmol/L (ref 0.5–2.0)

## 2015-01-28 MED ORDER — SODIUM CHLORIDE 0.9 % IJ SOLN
3.0000 mL | Freq: Two times a day (BID) | INTRAMUSCULAR | Status: DC
  Administered 2015-01-28: 3 mL via INTRAVENOUS

## 2015-01-28 MED ORDER — AMITRIPTYLINE HCL 50 MG PO TABS
25.0000 mg | ORAL_TABLET | Freq: Two times a day (BID) | ORAL | Status: DC
  Administered 2015-01-28: 25 mg via ORAL
  Filled 2015-01-28: qty 2

## 2015-01-28 MED ORDER — ASPIRIN EC 81 MG PO TBEC
81.0000 mg | DELAYED_RELEASE_TABLET | Freq: Every day | ORAL | Status: DC
  Administered 2015-01-28: 81 mg via ORAL
  Filled 2015-01-28: qty 1

## 2015-01-28 MED ORDER — LORATADINE 10 MG PO TABS
10.0000 mg | ORAL_TABLET | Freq: Every day | ORAL | Status: DC
  Administered 2015-01-28: 10 mg via ORAL
  Filled 2015-01-28: qty 1

## 2015-01-28 MED ORDER — PERPHENAZINE-AMITRIPTYLINE 2-25 MG PO TABS: 1.0000 | ORAL_TABLET | Freq: Two times a day (BID) | ORAL | Status: DC

## 2015-01-28 MED ORDER — ASPIRIN EC 81 MG PO TBEC: 81.0000 mg | DELAYED_RELEASE_TABLET | Freq: Once | ORAL | Status: DC

## 2015-01-28 MED ORDER — ACETAMINOPHEN 650 MG RE SUPP: 650.0000 mg | Freq: Four times a day (QID) | RECTAL | Status: DC | PRN

## 2015-01-28 MED ORDER — SODIUM CHLORIDE 0.9 % IV SOLN
INTRAVENOUS | Status: DC
  Administered 2015-01-28: 02:00:00 via INTRAVENOUS

## 2015-01-28 MED ORDER — ENOXAPARIN SODIUM 40 MG/0.4ML ~~LOC~~ SOLN
40.0000 mg | SUBCUTANEOUS | Status: DC
  Administered 2015-01-28: 40 mg via SUBCUTANEOUS
  Filled 2015-01-28: qty 0.4

## 2015-01-28 MED ORDER — INSULIN ASPART 100 UNIT/ML ~~LOC~~ SOLN
0.0000 [IU] | SUBCUTANEOUS | Status: DC
  Administered 2015-01-28: 1 [IU] via SUBCUTANEOUS
  Administered 2015-01-28 (×2): 2 [IU] via SUBCUTANEOUS
  Filled 2015-01-28: qty 2
  Filled 2015-01-28: qty 1
  Filled 2015-01-28: qty 2
  Filled 2015-01-28: qty 1

## 2015-01-28 MED ORDER — ACETAMINOPHEN 325 MG PO TABS: 650.0000 mg | ORAL_TABLET | Freq: Four times a day (QID) | ORAL | Status: DC | PRN

## 2015-01-28 MED ORDER — PERPHENAZINE 2 MG PO TABS
2.0000 mg | ORAL_TABLET | Freq: Two times a day (BID) | ORAL | Status: DC
  Administered 2015-01-28: 2 mg via ORAL
  Filled 2015-01-28 (×2): qty 1

## 2015-01-28 MED ORDER — PRAVASTATIN SODIUM 20 MG PO TABS: 40.0000 mg | ORAL_TABLET | Freq: Every day | ORAL | Status: DC

## 2015-01-28 MED ORDER — PREDNISONE 10 MG (21) PO TBPK: ORAL_TABLET | ORAL | Status: DC

## 2015-01-28 NOTE — Discharge Summary (Addendum)
[facility] [place] NAME: Anna Leonard    MR#:  [id number]  DATE OF BIRTH:  1936/05/01  DATE OF ADMISSION:  01/27/2015 ADMITTING PHYSICIAN: Juluis Mire, MD  DATE OF DISCHARGE:01/28/2015  PRIMARY CARE PHYSICIAN: FELDPAUSCH, DALE E, MD    ADMISSION DIAGNOSIS:  SIRS (systemic inflammatory response syndrome) [A41.9] Cerebral infarction due to unspecified mechanism [I63.9]  DISCHARGE DIAGNOSIS:  Bells palsy SIRS- ruled out.  SECONDARY DIAGNOSIS:   Past Medical History  Diagnosis Date  . Hypertension   . Allergy   . Type 2 diabetes mellitus   . HLD (hyperlipidemia)   . Anxiety   . Paralysis     left arm    HOSPITAL COURSE:   1.  left facial droop, new onset. Symptoms noticed by her son 4 days ago.   Suspected CVA on admission, CT head negative. No other focal weakness on limbs.   Pt was seen by neurologist- and he strongly felt this being bell's palsy and no a stroke.   After discussion with pt- discontinue MRI and Cardotid doppler studies and she is discharge home.   She was seen in hospital with Speech and swallow .  2. History of traumatic brain injury in childhood, status post resection/craniotomy with residual left upper exudative weakness. No new worsening. Able to manage her day to day activities. mnitored telemetry, nothing by mouth untill swallow evaluation- then given diet, neuro watch, continue aspirin, statin, order echocardiogram, bilateral carotid Dopplers, MRI brain and neurology consultation. Later other tests canceled per neuro guidance.  3. Mild tachycardia with tachypnea-   Initially suspected SIRS- but ruled out.   Blood cx negative, No rise in WBCs- lactic acid came down.   Likely was dehydrated on presentation.  4. Hypertension stable on home medications. BP within normal range.  5. Diabetes mellitus type 2, on metformin, stable.  on sliding scale insulin, follow up blood sugars. 6.  Hyperlipidemia, stable on statin. Continue same. 7. History of anxiety/depression, stable on home medications. Continue same.  DISCHARGE CONDITIONS:   Stable.  CONSULTS OBTAINED:  Treatment Team:  Leotis Pain, MD  DRUG ALLERGIES:   Allergies  Allergen Reactions  . Amoxicillin-Pot Clavulanate Other (See Comments)    diarrhea    DISCHARGE MEDICATIONS:   Current Discharge Medication List    START taking these medications   Details  predniSONE (STERAPRED UNI-PAK 21 TAB) 10 MG (21) TBPK tablet Take 6 tabs first day, 5 tab next, then 4 on day 3rd, 3 tabs on day 4th , 2 tab on day 5th, and 1 tab on 6th day. Qty: 21 tablet, Refills: 0      CONTINUE these medications which have NOT CHANGED   Details  aspirin EC 81 MG tablet Take by mouth daily.     lisinopril-hydrochlorothiazide (PRINZIDE,ZESTORETIC) 20-25 MG per tablet TAKE ONE TABLET BY MOUTH ONCE DAILY    loratadine (CLARITIN) 10 MG tablet Take by mouth daily.     lovastatin (MEVACOR) 40 MG tablet Take 80 mg by mouth at bedtime.     metFORMIN (GLUCOPHAGE) 500 MG tablet Take 500 mg by mouth at bedtime.     perphenazine-amitriptyline (ETRAFON/TRIAVIL) 2-25 MG TABS Take by mouth 2 (two) times daily.          DISCHARGE INSTRUCTIONS:    Follow  with PMD in 2 weeks.  If you experience worsening of your admission symptoms, develop shortness of breath, life threatening emergency, suicidal or homicidal thoughts you must seek  medical attention immediately by calling 911 or calling your MD immediately  if symptoms less severe.  You Must read complete instructions/literature along with all the possible adverse reactions/side effects for all the Medicines you take and that have been prescribed to you. Take any new Medicines after you have completely understood and accept all the possible adverse reactions/side effects.   Please note  You were cared for by a hospitalist during your hospital stay. If you have any questions  about your discharge medications or the care you received while you were in the hospital after you are discharged, you can call the unit and asked to speak with the hospitalist on call if the hospitalist that took care of you is not available. Once you are discharged, your primary care physician will handle any further medical issues. Please note that NO REFILLS for any discharge medications will be authorized once you are discharged, as it is imperative that you return to your primary care physician (or establish a relationship with a primary care physician if you do not have one) for your aftercare needs so that they can reassess your need for medications and monitor your lab values.    Today   CHIEF COMPLAINT:   Chief Complaint  Patient presents with  . Weakness    HISTORY OF PRESENT ILLNESS:  Anna Leonard  is a 79 y.o. 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 According to the patient's daughter who is with the patient this time she went to her mom's house this evening to take her to dinner and noticed left facial droop. Patient not aware since how long she has been having facial droop. According to the patient's son who is also with the patient at this time and who lives with his mom, he noticed left facial asymmetry 4 days ago but since she did not complain he did not bother to attend on it. Patient admits having some left-sided facial numbness and tingling for the past 1 week. Denies any focal weakness or numbness, no speech or swallowing difficulties, no visual disturbances, no bladder or bowel disturbances, no gait abnormalities. In the emergency room patient was evaluated by the ED physician and noted to have left facial droop. She was also noted  to be mildly tachycardic with heart rate of 114 bpm and tachypnea with respiratory rate of 22/m. She denies any history of fever, illness, URI symptoms, cough, chest pain, shortness of breath, palpitations, dizziness, loss of consciousness. Denies any nausea, vomiting, diarrhea, dysuria, abdominal pain. Workup revealed CT head significant for right vital encephalomalacia with overlying craniotomy but negative for any acute CNS pathology. Chest x-ray unremarkable for any acute cardiopulmonary pathology. EKG sinus tachycardia with ventricular rate of 119 bpm, no acute ischemic changes. Lab work normal CBC elevated blood sugar of 221 but otherwise normal CMP, elevated lactic acid of 2.3. Because of tachycardia and tachypnea, meeting 2 out of 4 sirs criteria, ED physician who opined SIRS with possible sepsis, blood cultures were done and patient was given IV Zosyn. As noted earlier patient denies any history of any recent fever or cough or illnesses.   VITAL SIGNS:  Blood pressure 146/69, pulse 108, temperature 98.4 F (36.9 C), temperature source Oral, resp. rate 16, height 5\' 2"  (1.575 m), weight 65.908 kg (  145 lb 4.8 oz), SpO2 100 %.  I/O:   Intake/Output Summary (Last 24 hours) at 01/28/15 1206 Last data filed at 01/28/15 1050  Gross per 24 hour  Intake 294.25 ml  Output   1451 ml  Net -1156.75 ml    PHYSICAL EXAMINATION:  Constitutional: She is oriented to person, place, and time. She appears well-developed and well-nourished. No distress.  HENT:  Head: Normocephalic and atraumatic.  Right Ear: External ear normal.  Left Ear: External ear normal.  Nose: Nose normal.  Mouth/Throat: Oropharynx is clear and moist. No oropharyngeal exudate.  Eyes: EOM are normal. Pupils are equal, round, and reactive to light. No scleral icterus.  Neck: Normal range of motion. Neck supple. No JVD present. No thyromegaly present.  Cardiovascular: Normal rate, regular rhythm, normal heart sounds and intact  distal pulses. Exam reveals no friction rub.  No murmur heard. Respiratory: Effort normal and breath sounds normal. No respiratory distress. She has no wheezes. She has no rales. She exhibits no tenderness.  GI: Soft. Bowel sounds are normal. She exhibits no distension and no mass. There is no tenderness. There is no rebound and no guarding.  Musculoskeletal: Normal range of motion. She exhibits no edema.  Lymphadenopathy:   She has no cervical adenopathy.  Neurological: She is alert and oriented to person, place, and time. She displays normal reflexes. She exhibits normal muscle tone.  Left-sided facial asymmetry +. Weakness of left UPPER extremity which is chronic.  Skin: Skin is warm. No rash noted. No erythema.  Psychiatric: She has a normal mood and affect. Her behavior is normal. Thought content normal.   DATA REVIEW:   CBC  Recent Labs Lab 01/28/15 0207  WBC 2.4*  HGB 10.2*  HCT 30.5*  PLT 226    Chemistries   Recent Labs Lab 01/27/15 1815 01/28/15 0207  NA 133* 138  K 3.6 3.7  CL 96* 107  CO2 26 24  GLUCOSE 221* 187*  BUN 17 15  CREATININE 0.71 0.63  CALCIUM 9.7 8.7*  AST 26  --   ALT 14  --   ALKPHOS 113  --   BILITOT 0.4  --     Cardiac Enzymes  Recent Labs Lab 01/27/15 1815  TROPONINI <0.03    Microbiology Results  Results for orders placed or performed during the hospital encounter of 01/27/15  Blood culture (routine x 2)     Status: None (Preliminary result)   Collection Time: 01/27/15  8:47 PM  Result Value Ref Range Status   Specimen Description BLOOD RIGHT WRIST  Final   Special Requests BOTTLES DRAWN AEROBIC AND ANAEROBIC AER5ML,ANA7ML  Final   Culture NO GROWTH < 12 HOURS  Final   Report Status PENDING  Incomplete  Blood culture (routine x 2)     Status: None (Preliminary result)   Collection Time: 01/27/15  8:47 PM  Result Value Ref Range Status   Specimen Description BLOOD RIGHT ARM  Final   Special Requests BOTTLES DRAWN  AEROBIC AND ANAEROBIC 5MLANA,AER5ML  Final   Culture NO GROWTH < 12 HOURS  Final   Report Status PENDING  Incomplete    RADIOLOGY:  Dg Chest 2 View  01/27/2015   CLINICAL DATA:  Facial droop for 1 week.  History of lymphoma.  EXAM: CHEST  2 VIEW  COMPARISON:  Chest CT 02/27/2014  FINDINGS: The cardiac silhouette, mediastinal and hilar contours are normal. The lungs are clear. Complete resolution of mediastinal and hilar lymphadenopathy. Left apical density appears  to be ectatic vasculature based on the prior CT scan. Remote rib fractures are noted on the left.  IMPRESSION: No acute cardiopulmonary findings. Resolution of bulky mediastinal and hilar lymphadenopathy.   Electronically Signed   By: Marijo Sanes M.D.   On: 01/27/2015 18:57   Ct Head Wo Contrast  01/27/2015   CLINICAL DATA:  Left-sided facial droop and weakness. Slurred speech.  EXAM: CT HEAD WITHOUT CONTRAST  TECHNIQUE: Contiguous axial images were obtained from the base of the skull through the vertex without intravenous contrast.  COMPARISON:  None.  FINDINGS: Left maxillary sinusitis is noted. Status post right parietal craniotomy. Right parietal encephalomalacia is noted consistent with prior surgical resection. No mass effect or midline shift is noted. Ventricular size is within normal limits. There is no evidence of mass lesion, hemorrhage or acute infarction.  IMPRESSION: Left maxillary sinusitis.  Right parietal encephalomalacia is noted with overlying craniotomy defect consistent with prior surgical resection. No acute intracranial abnormality seen.   Electronically Signed   By: Marijo Conception, M.D.   On: 01/27/2015 18:32      Management plans discussed with the patient, family and they are in agreement.  CODE STATUS:     Code Status Orders        Start     Ordered   01/28/15 0117  Full code   Continuous     01/28/15 0116      TOTAL TIME TAKING CARE OF THIS PATIENT: 35 minutes.    Vaughan Basta M.D on  01/28/2015 at 12:06 PM  Between 7am to 6pm - Pager - [phone]  After 6pm go to [email or web address] - password [email or web address] [facility]  [facility]  Office  [phone]  CC: Primary care physician; [facility], Chrissie Noa, MD

## 2015-01-28 NOTE — Progress Notes (Signed)
Pt discharged to home via wc.  Instructions and rx given to pt.  Questions answered.  No distress.  

## 2015-01-28 NOTE — Progress Notes (Signed)
Assessment completed.  IVF infusing well rt wrist.  No distress on ra.  Denies chest pain.  Pt has existing lt side weakness from past TBI.  Neuro assessment completed.  Lt facial droop present, pt states she has had for about 1 week.  Asking to go home at this time, explained testing and MD to see her this am.  Fall precautions in place, pt denies need at this time.  Remains NPO for speech eval.

## 2015-01-28 NOTE — Progress Notes (Signed)
[facility]  [phone] [phone]) [phone]  ID: Anna Leonard OB: 1935-12-21  MR#: [id number]  FUX#:[id number]  Patient Care Team: Sofie Hartigan, MD as PCP - General (Family Medicine)  CHIEF COMPLAINT:  Chief Complaint  Patient presents with  . Follow-up    lymphoma    INTERVAL HISTORY: Patient returns to clinic today for further evaluation and laboratory work. She continues to feel well and remains asymptomatic. She has no neurologic complaints. She denies any fevers or night sweats. She has no chest pain, cough, or shortness of breath. She denies any nausea, vomiting, constipation, or diarrhea. She has no urinary complaints. Patient offers no specific complaints today.   REVIEW OF SYSTEMS:   Review of Systems  Constitutional: Negative.   HENT: Negative.   Respiratory: Negative.   Cardiovascular: Negative.     As per HPI. Otherwise, a complete review of systems is negatve.  PAST MEDICAL HISTORY: Past Medical History  Diagnosis Date  . Hypertension   . Allergy   . Type 2 diabetes mellitus   . HLD (hyperlipidemia)   . Anxiety   . Paralysis     left arm    PAST SURGICAL HISTORY: Past Surgical History  Procedure Laterality Date  . Vesicovaginal fistula closure w/ tah    . Appendectomy      FAMILY HISTORY Family History  Problem Relation Age of Onset  . Hypertension Mother   . Cancer Mother     Breast  . Cancer Father     Testicular  . Diabetes Sister   . Diabetes Brother        ADVANCED DIRECTIVES:    HEALTH MAINTENANCE: History  Substance Use Topics  . Smoking status: Never Smoker   . Smokeless tobacco: Never Used  . Alcohol Use: No     Colonoscopy:  PAP:  Bone density:  Lipid panel:  Allergies  Allergen Reactions  . Amoxicillin-Pot Clavulanate Other (See Comments)    diarrhea    Current Outpatient Prescriptions  Medication Sig Dispense Refill  . aspirin EC 81 MG tablet Take by mouth daily.      Marland Kitchen lisinopril-hydrochlorothiazide (PRINZIDE,ZESTORETIC) 20-25 MG per tablet TAKE ONE TABLET BY MOUTH ONCE DAILY    . loratadine (CLARITIN) 10 MG tablet Take by mouth daily.     Marland Kitchen lovastatin (MEVACOR) 40 MG tablet Take 80 mg by mouth at bedtime.     . metFORMIN (GLUCOPHAGE) 500 MG tablet Take 500 mg by mouth at bedtime.     Marland Kitchen perphenazine-amitriptyline (ETRAFON/TRIAVIL) 2-25 MG TABS Take by mouth 2 (two) times daily.     . predniSONE (STERAPRED UNI-PAK 21 TAB) 10 MG (21) TBPK tablet Take 6 tabs first day, 5 tab next, then 4 on day 3rd, 3 tabs on day 4th , 2 tab on day 5th, and 1 tab on 6th day. 21 tablet 0   No current facility-administered medications for this visit.    OBJECTIVE: Filed Vitals:   01/17/15 1621  BP: 142/78  Pulse: 101  Temp: 98.2 F (36.8 C)  Resp: 18     Body mass index is 26.08 kg/(m^2).    ECOG FS:0 - Asymptomatic  General: Well-developed, well-nourished, no acute distress. Eyes: anicteric sclera. HEENT: Normocephalic, moist mucous membranes, clear oropharnyx. Lungs: Clear to auscultation bilaterally. Heart: Regular rate and rhythm. No rubs, murmurs, or gallops. Abdomen: Soft, nontender, nondistended. No organomegaly noted, normoactive bowel sounds. Musculoskeletal: No edema, cyanosis, or clubbing. Neuro: Alert, answering all questions appropriately. Cranial nerves grossly intact.  Skin: No rashes or petechiae noted. Psych: Normal affect. Lymphatics: No cervical, calvicular, axillary or inguinal LAD.   LAB RESULTS:  Lab Results  Component Value Date   NA 138 01/28/2015   K 3.7 01/28/2015   CL 107 01/28/2015   CO2 24 01/28/2015   GLUCOSE 187* 01/28/2015   BUN 15 01/28/2015   CREATININE 0.63 01/28/2015   CALCIUM 8.7* 01/28/2015   PROT 7.9 01/27/2015   ALBUMIN 4.2 01/27/2015   AST 26 01/27/2015   ALT 14 01/27/2015   ALKPHOS 113 01/27/2015   BILITOT 0.4 01/27/2015   GFRNONAA >60 01/28/2015   GFRAA >60 01/28/2015    Lab Results  Component Value  Date   WBC 2.4* 01/28/2015   NEUTROABS 0.9* 01/17/2015   HGB 10.2* 01/28/2015   HCT 30.5* 01/28/2015   MCV 92.2 01/28/2015   PLT 226 01/28/2015     STUDIES: Dg Chest 2 View  01/27/2015   CLINICAL DATA:  Facial droop for 1 week.  History of lymphoma.  EXAM: CHEST  2 VIEW  COMPARISON:  Chest CT 02/27/2014  FINDINGS: The cardiac silhouette, mediastinal and hilar contours are normal. The lungs are clear. Complete resolution of mediastinal and hilar lymphadenopathy. Left apical density appears to be ectatic vasculature based on the prior CT scan. Remote rib fractures are noted on the left.  IMPRESSION: No acute cardiopulmonary findings. Resolution of bulky mediastinal and hilar lymphadenopathy.   Electronically Signed   By: Marijo Sanes M.D.   On: 01/27/2015 18:57   Ct Head Wo Contrast  01/27/2015   CLINICAL DATA:  Left-sided facial droop and weakness. Slurred speech.  EXAM: CT HEAD WITHOUT CONTRAST  TECHNIQUE: Contiguous axial images were obtained from the base of the skull through the vertex without intravenous contrast.  COMPARISON:  None.  FINDINGS: Left maxillary sinusitis is noted. Status post right parietal craniotomy. Right parietal encephalomalacia is noted consistent with prior surgical resection. No mass effect or midline shift is noted. Ventricular size is within normal limits. There is no evidence of mass lesion, hemorrhage or acute infarction.  IMPRESSION: Left maxillary sinusitis.  Right parietal encephalomalacia is noted with overlying craniotomy defect consistent with prior surgical resection. No acute intracranial abnormality seen.   Electronically Signed   By: Marijo Conception, M.D.   On: 01/27/2015 18:32    ASSESSMENT: At least a stage II mantle cell lymphoma.  PLAN:    1. Mantle cell lymphoma: PET scan results from October 15, 2014 reviewed independently with no evidence of disease. Previously, no bone marrow biopsy was performed secondary to patient refusal after a broken pelvis.  Return to clinic in 3 months with repeat laboratory work, CT scans, and further evaluation. Plan to reimage with CT scan every 6 months for 2 years.  2. Fractured pelvis: Resolved. No further interventionSecondary to traumatic fall. Continue physical therapy as ordered. Patient reports no surgery is necessary. 3. Constipation: Continue OTC miralax as needed. 4. Nausea: Resolved.    Patient expressed understanding and was in agreement with this plan. She also understands that She can call clinic at any time with any questions, concerns, or complaints.   No matching staging information was found for the patient.  Lloyd Huger, MD   01/28/2015 4:35 PM

## 2015-01-28 NOTE — Evaluation (Signed)
Clinical/Bedside Swallow Evaluation Patient Details  Name: Anna Leonard MRN: [id number] Date of Birth: [birth year]  Today's Date: 01/28/2015 Time: SLP Start Time (ACUTE ONLY): 1010 SLP Stop Time (ACUTE ONLY): 1110 SLP Time Calculation (min) (ACUTE ONLY): 60 min  Past Medical History:  Past Medical History  Diagnosis Date  . Hypertension   . Allergy   . Type 2 diabetes mellitus   . HLD (hyperlipidemia)   . Anxiety   . Paralysis     left arm   Past Surgical History:  Past Surgical History  Procedure Laterality Date  . Vesicovaginal fistula closure w/ tah    . Appendectomy     HPI:  pt admitted w/ concern for CVA; no h/o dysphagia and was eating a regular diet at home. Speech is slighlty dysarthric d/t Left labial decreased tone. MD and Neurology has assessed and ruled in [place].   Assessment / Plan / Recommendation Clinical Impression  Pt appeared to demo. no overt s/s of aspiration w/ trials of liquids and solids; no significant oral phase deficits noted as she was able to clear appropriately b/t trials - utilized lingual sweeping strategy appropriately when needed. Pt demo. no decline in respiratory status; vocal quality clear b/t trials. Pt has noted when she uses a straw, she has labial leakage in the Left corner. When using small, single sips by cup, this was not noted. Pt appears at reduced risk for aspiration following general aspiration precautions. Education given to pt and family on general aspiration precautions esp. using small, single bites and sips and clearing mouth fully b/t bites. Pt and family agreed. No OMEs given sec. to MD's dx of Bell's Palsy. Discussed reducing stress, hydrating more, and resting. Pt/family agreed.     Aspiration Risk   (reduced)    Diet Recommendation Dysphagia 3 (Mech soft);Thin   Medication Administration: Whole meds with liquid Compensations: Slow rate;Small sips/bites    Other  Recommendations Oral Care Recommendations:  Oral care BID;Oral care before and after PO;Patient independent with oral care   Follow Up Recommendations       Frequency and Duration        Pertinent Vitals/Pain denied    SLP Swallow Goals  n/a    Swallow Study Prior Functional Status   resides at home; independent w/ family near   General Date of Onset: 01/27/15 Other Pertinent Information: pt admitted w/ concern for CVA; no h/o dysphagia and was eating a regular diet at home. Speech is slighlty dysarthric d/t Left labial decreased tone. MD and Neurology has assessed and ruled in [place]. Type of Study: Bedside swallow evaluation Previous Swallow Assessment: none Diet Prior to this Study: NPO (regular at home) Temperature Spikes Noted: No Respiratory Status: Room air History of Recent Intubation: No Behavior/Cognition: Alert;Cooperative;Pleasant mood Oral Cavity - Dentition: Adequate natural dentition/normal for age (missing few) Self-Feeding Abilities: Able to feed self;Needs set up (sec. to baseline Left UE weakness/decreased movement) Patient Positioning: Upright in bed Baseline Vocal Quality: Normal Volitional Cough: Strong Volitional Swallow: Able to elicit    Oral/Motor/Sensory Function Overall Oral Motor/Sensory Function: Impaired Labial ROM: Reduced left Labial Symmetry: Abnormal symmetry left Labial Strength: Reduced Labial Sensation: Reduced Lingual ROM: Within Functional Limits Lingual Symmetry:  (deviates slightly to the R w/ prolonged protrusion) Lingual Strength: Reduced (slightly reduced on the Left) Lingual Sensation: Reduced Facial ROM: Reduced left Facial Symmetry: Left droop;Left drooping eyelid Facial Strength: Reduced Facial Sensation: Reduced Velum: Within Functional Limits Mandible: Within Functional Limits   Ice  Chips Ice chips: Within functional limits Presentation: Self Fed;Spoon (x3 )   Thin Liquid Thin Liquid: Within functional limits Presentation: Cup;Self Fed (~3 ozs)     Nectar Thick Nectar Thick Liquid: Not tested   Honey Thick Honey Thick Liquid: Not tested   Puree Puree: Within functional limits Presentation: Self Fed;Spoon (4 ozs)   Solid   GO    Solid: Within functional limits Presentation: Self Fed (6 trials)       Anna Leonard 01/28/2015,1:33 PM

## 2015-01-28 NOTE — Consult Note (Signed)
CC: L sided facial droop.   HPI: Anna Leonard is an 79 y.o. 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  On examination pt also unable to close L eye.   Past Medical History  Diagnosis Date  . Hypertension   . Allergy   . Type 2 diabetes mellitus   . HLD (hyperlipidemia)   . Anxiety   . Paralysis     left arm    Past Surgical History  Procedure Laterality Date  . Vesicovaginal fistula closure w/ tah    . Appendectomy      Family History  Problem Relation Age of Onset  . Hypertension Mother   . Cancer Mother     Breast  . Cancer Father     Testicular  . Diabetes Sister   . Diabetes Brother     Social History:  reports that she has never smoked. She has never used smokeless tobacco. She reports that she does not drink alcohol or use illicit drugs.  Allergies  Allergen Reactions  . Amoxicillin-Pot Clavulanate Other (See Comments)    diarrhea    Medications: I have reviewed the patient's current medications.  ROS: History obtained from the patient  General ROS: negative for - chills, fatigue, fever, night sweats, weight gain or weight loss Psychological ROS: negative for - behavioral disorder, hallucinations, memory difficulties, mood swings or suicidal ideation Ophthalmic ROS: negative for - blurry vision, double vision, eye pain or loss of vision ENT ROS: negative for - epistaxis, nasal discharge, oral lesions, sore throat, tinnitus or vertigo Allergy and Immunology ROS: negative for - hives or itchy/watery eyes Hematological and Lymphatic ROS: negative for - bleeding problems, bruising or swollen lymph nodes Endocrine ROS: negative for - galactorrhea, hair pattern changes, polydipsia/polyuria or temperature intolerance Respiratory ROS: negative  for - cough, hemoptysis, shortness of breath or wheezing Cardiovascular ROS: negative for - chest pain, dyspnea on exertion, edema or irregular heartbeat Gastrointestinal ROS: negative for - abdominal pain, diarrhea, hematemesis, nausea/vomiting or stool incontinence Genito-Urinary ROS: negative for - dysuria, hematuria, incontinence or urinary frequency/urgency Musculoskeletal ROS: negative for - joint swelling or muscular weakness Neurological ROS: as noted in HPI Dermatological ROS: negative for rash and skin lesion changes  Physical Examination: Blood pressure 146/69, pulse 108, temperature 98.4 F (36.9 C), temperature source Oral, resp. rate 16, height 5\' 2"  (1.575 m), weight 65.908 kg (145 lb 4.8 oz), SpO2 100 %.    Neurological Examination Mental Status: Alert, oriented, thought content appropriate.  Speech fluent without evidence of aphasia.  Able to follow 3 step commands without difficulty. Cranial Nerves: II: Discs flat bilaterally; Visual fields grossly normal, pupils equal, round, reactive to light and accommodation III,IV, VI: ptosis not present, extra-ocular motions intact bilaterally V,VII: smile symmetric, facial light touch sensation normal bilaterally VIII: hearing normal bilaterally IX,X: gag reflex present XI: bilateral shoulder shrug XII: midline tongue extension Motor: Right : Upper extremity   5/5    Left:     Upper extremity   4/5  Lower extremity   5/5     Lower extremity   4/5 Tone and bulk:normal tone throughout; no atrophy noted Sensory: Pinprick and light touch intact throughout, bilaterally Deep Tendon Reflexes: 2+ and symmetric throughout Plantars: Right: downgoing   Left: downgoing Cerebellar: normal finger-to-nose, normal rapid alternating movements and  normal heel-to-shin test Gait: not tested       Laboratory Studies:   Basic Metabolic Panel:  Recent Labs Lab 01/27/15 1815 01/28/15 0207  NA 133* 138  K 3.6 3.7  CL 96* 107  CO2 26  24  GLUCOSE 221* 187*  BUN 17 15  CREATININE 0.71 0.63  CALCIUM 9.7 8.7*    Liver Function Tests:  Recent Labs Lab 01/27/15 1815  AST 26  ALT 14  ALKPHOS 113  BILITOT 0.4  PROT 7.9  ALBUMIN 4.2   No results for input(s): LIPASE, AMYLASE in the last 168 hours. No results for input(s): AMMONIA in the last 168 hours.  CBC:  Recent Labs Lab 01/27/15 1815 01/28/15 0207  WBC 3.7 2.4*  HGB 12.1 10.2*  HCT 34.6* 30.5*  MCV 92.1 92.2  PLT 267 226    Cardiac Enzymes:  Recent Labs Lab 01/27/15 1815  TROPONINI <0.03    BNP: Invalid input(s): POCBNP  CBG:  Recent Labs Lab 01/28/15 0130 01/28/15 0410 01/28/15 0744 01/28/15 1125  GLUCAP 158* 151* 126* 199*    Microbiology: Results for orders placed or performed during the hospital encounter of 01/27/15  Blood culture (routine x 2)     Status: None (Preliminary result)   Collection Time: 01/27/15  8:47 PM  Result Value Ref Range Status   Specimen Description BLOOD RIGHT WRIST  Final   Special Requests BOTTLES DRAWN AEROBIC AND ANAEROBIC [id number]  Final   Culture NO GROWTH < 12 HOURS  Final   Report Status PENDING  Incomplete  Blood culture (routine x 2)     Status: None (Preliminary result)   Collection Time: 01/27/15  8:47 PM  Result Value Ref Range Status   Specimen Description BLOOD RIGHT ARM  Final   Special Requests BOTTLES DRAWN AEROBIC AND ANAEROBIC [id number]  Final   Culture NO GROWTH < 12 HOURS  Final   Report Status PENDING  Incomplete    Coagulation Studies: No results for input(s): LABPROT, INR in the last 72 hours.  Urinalysis:  Recent Labs Lab 01/27/15 1904  COLORURINE YELLOW*  LABSPEC 1.013  PHURINE 6.0  GLUCOSEU >500*  HGBUR NEGATIVE  BILIRUBINUR NEGATIVE  KETONESUR NEGATIVE  PROTEINUR NEGATIVE  NITRITE NEGATIVE  LEUKOCYTESUR NEGATIVE    Lipid Panel:  No results found for: CHOL, TRIG, HDL, CHOLHDL, VLDL, LDLCALC  HgbA1C: No results found for: HGBA1C  Urine  Drug Screen:  No results found for: LABOPIA, COCAINSCRNUR, LABBENZ, AMPHETMU, THCU, LABBARB  Alcohol Level: No results for input(s): ETH in the last 168 hours.    Imaging: Dg Chest 2 View  01/27/2015   CLINICAL DATA:  Facial droop for 1 week.  History of lymphoma.  EXAM: CHEST  2 VIEW  COMPARISON:  Chest CT 02/27/2014  FINDINGS: The cardiac silhouette, mediastinal and hilar contours are normal. The lungs are clear. Complete resolution of mediastinal and hilar lymphadenopathy. Left apical density appears to be ectatic vasculature based on the prior CT scan. Remote rib fractures are noted on the left.  IMPRESSION: No acute cardiopulmonary findings. Resolution of bulky mediastinal and hilar lymphadenopathy.   Electronically Signed   By: Marijo Sanes M.D.   On: 01/27/2015 18:57   Ct Head Wo Contrast  01/27/2015   CLINICAL DATA:  Left-sided facial droop and weakness. Slurred speech.  EXAM: CT HEAD WITHOUT CONTRAST  TECHNIQUE: Contiguous axial images were obtained from the base of the skull through the vertex without intravenous contrast.  COMPARISON:  None.  FINDINGS: Left maxillary  sinusitis is noted. Status post right parietal craniotomy. Right parietal encephalomalacia is noted consistent with prior surgical resection. No mass effect or midline shift is noted. Ventricular size is within normal limits. There is no evidence of mass lesion, hemorrhage or acute infarction.  IMPRESSION: Left maxillary sinusitis.  Right parietal encephalomalacia is noted with overlying craniotomy defect consistent with prior surgical resection. No acute intracranial abnormality seen.   Electronically Signed   By: Marijo Conception, M.D.   On: 01/27/2015 18:32     Assessment/Plan:  79 y.o. 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  On examination pt also unable to close L eye.   - symptoms consistent with L Bell's Pausly - no further imaging, d/c planning - eye patch, lacrilube to L eye - medrol 7 day taper  Harlan Vinal  01/28/2015, 1:40 PM

## 2015-02-01 LAB — CULTURE, BLOOD (ROUTINE X 2)
Culture: NO GROWTH
Culture: NO GROWTH

## 2015-02-27 ENCOUNTER — Other Ambulatory Visit: Payer: Self-pay | Admitting: Family Medicine

## 2015-02-27 ENCOUNTER — Ambulatory Visit
Admission: RE | Admit: 2015-02-27 | Discharge: 2015-02-27 | Disposition: A | Discharge status: Home / Self Care | Payer: Commercial Managed Care - HMO | Source: Ambulatory Visit | Attending: Family Medicine | Admitting: Family Medicine

## 2015-02-27 DIAGNOSIS — Z1231 Encounter for screening mammogram for malignant neoplasm of breast: Secondary | ICD-10-CM | POA: Insufficient documentation

## 2015-02-27 HISTORY — DX: Malignant (primary) neoplasm, unspecified: C80.1

## 2015-03-08 ENCOUNTER — Encounter: Payer: Self-pay | Admitting: Gynecology

## 2015-03-08 ENCOUNTER — Ambulatory Visit
Admission: EM | Admit: 2015-03-08 | Discharge: 2015-03-08 | Disposition: A | Discharge status: Home / Self Care | Payer: Commercial Managed Care - HMO | Attending: Family Medicine | Admitting: Family Medicine

## 2015-03-08 DIAGNOSIS — G510 Bell's palsy: Secondary | ICD-10-CM | POA: Diagnosis not present

## 2015-03-08 DIAGNOSIS — E119 Type 2 diabetes mellitus without complications: Secondary | ICD-10-CM | POA: Diagnosis not present

## 2015-03-08 DIAGNOSIS — Z7982 Long term (current) use of aspirin: Secondary | ICD-10-CM | POA: Diagnosis not present

## 2015-03-08 DIAGNOSIS — E785 Hyperlipidemia, unspecified: Secondary | ICD-10-CM | POA: Diagnosis not present

## 2015-03-08 DIAGNOSIS — I10 Essential (primary) hypertension: Secondary | ICD-10-CM | POA: Insufficient documentation

## 2015-03-08 DIAGNOSIS — N390 Urinary tract infection, site not specified: Secondary | ICD-10-CM | POA: Diagnosis present

## 2015-03-08 DIAGNOSIS — R42 Dizziness and giddiness: Secondary | ICD-10-CM | POA: Diagnosis present

## 2015-03-08 DIAGNOSIS — F419 Anxiety disorder, unspecified: Secondary | ICD-10-CM | POA: Diagnosis not present

## 2015-03-08 HISTORY — DX: Bell's palsy: G51.0

## 2015-03-08 LAB — COMPREHENSIVE METABOLIC PANEL
ALT: 55 U/L — ABNORMAL HIGH (ref 14–54)
AST: 83 U/L — ABNORMAL HIGH (ref 15–41)
Albumin: 2.7 g/dL — ABNORMAL LOW (ref 3.5–5.0)
Alkaline Phosphatase: 117 U/L (ref 38–126)
Anion gap: 17 — ABNORMAL HIGH (ref 5–15)
BUN: 57 mg/dL — ABNORMAL HIGH (ref 6–20)
CO2: 20 mmol/L — ABNORMAL LOW (ref 22–32)
Calcium: 8.7 mg/dL — ABNORMAL LOW (ref 8.9–10.3)
Chloride: 93 mmol/L — ABNORMAL LOW (ref 101–111)
Creatinine, Ser: 2.06 mg/dL — ABNORMAL HIGH (ref 0.44–1.00)
GFR calc Af Amer: 26 mL/min — ABNORMAL LOW (ref >60)
GFR calc non Af Amer: 22 mL/min — ABNORMAL LOW (ref >60)
Glucose, Bld: 269 mg/dL — ABNORMAL HIGH (ref 65–99)
Potassium: 3.8 mmol/L (ref 3.5–5.1)
Sodium: 130 mmol/L — ABNORMAL LOW (ref 135–145)
Total Bilirubin: 0.6 mg/dL (ref 0.3–1.2)
Total Protein: 7.3 g/dL (ref 6.5–8.1)

## 2015-03-08 LAB — CBC WITH DIFFERENTIAL/PLATELET
Band Neutrophils: 11 %
Basophils Absolute: 0 10*3/uL (ref 0–0.1)
Basophils Relative: 0 %
Eosinophils Absolute: 0 10*3/uL (ref 0–0.7)
Eosinophils Relative: 0 %
HCT: 28.6 % — ABNORMAL LOW (ref 35.0–47.0)
Hemoglobin: 9.9 g/dL — ABNORMAL LOW (ref 12.0–16.0)
Lymphocytes Relative: 2 %
Lymphs Abs: 0.2 10*3/uL — ABNORMAL LOW (ref 1.0–3.6)
MCH: 29.9 pg (ref 26.0–34.0)
MCHC: 34.6 g/dL (ref 32.0–36.0)
MCV: 86.2 fL (ref 80.0–100.0)
Monocytes Absolute: 1.2 10*3/uL — ABNORMAL HIGH (ref 0.2–0.9)
Monocytes Relative: 12 %
Neutro Abs: 8.4 10*3/uL — ABNORMAL HIGH (ref 1.4–6.5)
Neutrophils Relative %: 75 %
Platelets: 176 10*3/uL (ref 150–440)
RBC: 3.31 MIL/uL — ABNORMAL LOW (ref 3.80–5.20)
RDW: 13.8 % (ref 11.5–14.5)
WBC: 9.8 10*3/uL (ref 3.6–11.0)

## 2015-03-08 LAB — URINALYSIS COMPLETE WITH MICROSCOPIC (ARMC ONLY)
Bilirubin Urine: NEGATIVE
Glucose, UA: NEGATIVE mg/dL
Ketones, ur: NEGATIVE mg/dL
Nitrite: NEGATIVE
PH: 5 (ref 5.0–8.0)
Protein, ur: >300 mg/dL — AB
Specific Gravity, Urine: 1.025 (ref 1.005–1.030)
Squamous Epithelial / LPF: NONE SEEN — AB

## 2015-03-08 MED ORDER — SULFAMETHOXAZOLE-TRIMETHOPRIM 800-160 MG PO TABS: 1.0000 | ORAL_TABLET | Freq: Two times a day (BID) | ORAL | Status: DC

## 2015-03-08 MED ORDER — CEFTRIAXONE SODIUM 1 G IJ SOLR
1.0000 g | Freq: Once | INTRAMUSCULAR | Status: AC
  Administered 2015-03-08: 1 g via INTRAMUSCULAR

## 2015-03-08 NOTE — ED Provider Notes (Signed)
CSN: [id number]     Arrival date & time 03/08/15  1847 History   First MD Initiated Contact with Patient 03/08/15 2022     Chief Complaint  Patient presents with  . Urinary Tract Infection  . Dizziness   (Consider location/radiation/quality/duration/timing/severity/associated sxs/prior Treatment) HPI   This 79 year old female who is accompanied by her daughter. She presents with a one-week history burning urination weakness heaviness in her legs and reportedly fell 2 times once on Wednesday and once on Thursday. She not have any injury from her falls. He has recently been diagnosed with Bell's palsy 4 weeks ago and received one week of prednisone. He is a diabetic and according to her daughter is in good control. Her urine has been very foul-smelling and she has had symptoms of a UTI. She has no fever upon presentation to our clinic or her daughter states that she was sweating fuse the earlier aching that her fever had broken. Augmentin reportedly gives her diarrhea.  Past Medical History  Diagnosis Date  . Hypertension   . Allergy   . Type 2 diabetes mellitus   . HLD (hyperlipidemia)   . Anxiety   . Paralysis     left arm  . Cancer     lymphoma- chemo  . Bell palsy    Past Surgical History  Procedure Laterality Date  . Vesicovaginal fistula closure w/ tah    . Appendectomy     Family History  Problem Relation Age of Onset  . Hypertension Mother   . Cancer Mother 55    Breast  . Cancer Father     Testicular  . Diabetes Sister   . Diabetes Brother    Social History  Substance Use Topics  . Smoking status: Never Smoker   . Smokeless tobacco: Never Used  . Alcohol Use: No   OB History    No data available     Review of Systems  Constitutional: Positive for chills, appetite change and fatigue.  Respiratory: Negative for cough, choking and shortness of breath.   Genitourinary: Positive for dysuria, urgency and frequency.  Neurological: Positive for dizziness.     Allergies  Amoxicillin-pot clavulanate  Home Medications   Prior to Admission medications   Medication Sig Start Date End Date Taking? Authorizing Provider  aspirin EC 81 MG tablet Take by mouth daily.    Yes Historical Provider, MD  lisinopril-hydrochlorothiazide (PRINZIDE,ZESTORETIC) 20-25 MG per tablet TAKE ONE TABLET BY MOUTH ONCE DAILY 12/13/14  Yes Historical Provider, MD  loratadine (CLARITIN) 10 MG tablet Take by mouth daily.    Yes Historical Provider, MD  lovastatin (MEVACOR) 40 MG tablet Take 80 mg by mouth at bedtime.  01/17/14  Yes Historical Provider, MD  metFORMIN (GLUCOPHAGE) 500 MG tablet Take 500 mg by mouth at bedtime.  01/10/14  Yes Historical Provider, MD  perphenazine-amitriptyline (ETRAFON/TRIAVIL) 2-25 MG TABS Take by mouth 2 (two) times daily.  07/24/14  Yes Historical Provider, MD  predniSONE (STERAPRED UNI-PAK 21 TAB) 10 MG (21) TBPK tablet Take 6 tabs first day, 5 tab next, then 4 on day 3rd, 3 tabs on day 4th , 2 tab on day 5th, and 1 tab on 6th day. 01/28/15  Yes Vaughan Basta, MD  sulfamethoxazole-trimethoprim (BACTRIM DS,SEPTRA DS) 800-160 MG per tablet Take 1 tablet by mouth 2 (two) times daily. 03/08/15   Malachy Chamber Zakari Couchman, PA-C   BP 108/53 mmHg  Pulse 120  Temp(Src) 98.6 F (37 C) (Oral)  Ht 5\' 3"  (1.6 m)  Wt 141 lb (63.957 kg)  BMI 24.98 kg/m2  SpO2 97% Physical Exam  Constitutional: She is oriented to person, place, and time. She appears well-developed and well-nourished.  HENT:  Head: Normocephalic and atraumatic.  Eyes: Pupils are equal, round, and reactive to light.  Neck: Neck supple.  Cardiovascular: Regular rhythm and normal heart sounds.  Exam reveals no gallop and no friction rub.   No murmur heard. Patient does have a tachycardia  Pulmonary/Chest: Effort normal and breath sounds normal. No respiratory distress. She has no wheezes. She has no rales.  Abdominal: Soft. Bowel sounds are normal.  Neurological: She is alert and oriented  to person, place, and time.  Skin: Skin is warm and dry.  Psychiatric: She has a normal mood and affect. Her behavior is normal. Judgment and thought content normal.  Nursing note and vitals reviewed.   ED Course  Procedures (including critical care time) Labs Review Labs Reviewed  URINALYSIS COMPLETEWITH MICROSCOPIC ([facility] ONLY) - Abnormal; Notable for the following:    APPearance CLOUDY (*)    Hgb urine dipstick 2+ (*)    Protein, ur >300 (*)    Leukocytes, UA 2+ (*)    Bacteria, UA MANY (*)    Squamous Epithelial / LPF NONE SEEN (*)    All other components within normal limits  CBC WITH DIFFERENTIAL/PLATELET - Abnormal; Notable for the following:    RBC 3.31 (*)    Hemoglobin 9.9 (*)    HCT 28.6 (*)    Neutro Abs 8.4 (*)    Lymphs Abs 0.2 (*)    Monocytes Absolute 1.2 (*)    All other components within normal limits  COMPREHENSIVE METABOLIC PANEL - Abnormal; Notable for the following:    Sodium 130 (*)    Chloride 93 (*)    CO2 20 (*)    Glucose, Bld 269 (*)    BUN 57 (*)    Creatinine, Ser 2.06 (*)    Calcium 8.7 (*)    Albumin 2.7 (*)    AST 83 (*)    ALT 55 (*)    GFR calc non Af Amer 22 (*)    GFR calc Af Amer 26 (*)    Anion gap 17 (*)    All other components within normal limits  URINE CULTURE    Imaging Review No results found.   MDM   1. UTI (lower urinary tract infection)     Discharge Medication List as of 03/08/2015  9:31 PM    START taking these medications   Details  sulfamethoxazole-trimethoprim (BACTRIM DS,SEPTRA DS) 800-160 MG per tablet Take 1 tablet by mouth 2 (two) times daily., Starting 03/08/2015, Until Discontinued, Print       Plan: 1. Test/x-ray results and diagnosis reviewed with patient 2. rx as per orders; risks, benefits, potential side effects reviewed with patient 3. Recommend supportive treatment with fluids,rest 4. F/u prn if symptoms worsen or don't improve Spoke with the patient and her daughter regarding her  findings. She does have a urinary tract infection will start  treating with Rocephin IM tonight and place her on Septra DS twice a day for the next 7 days. In addition she has renal insufficiency and high BUN indicating dehydration. I've asked her to drink fluids enough to keep her urine clear. I was adamant that if she is worse,has increased fevers or is not progressing they need to take her to the emergency department immediately. I do want to see her in the clinic on  Sunday to see how she is doing and making sure she is improving. She will need to follow-up with her primary care physician next week. The understand the instructions. She received her Rocephin without incident. Start her Septra beginning tomorrow morning  Lorin Picket, [place] 03/08/15 2150

## 2015-03-08 NOTE — Discharge Instructions (Signed)

## 2015-03-08 NOTE — ED Notes (Signed)
Patient c/o burning with urination x 1 week / weakness and dizziness.

## 2015-03-10 ENCOUNTER — Encounter: Payer: Self-pay | Admitting: Gynecology

## 2015-03-10 ENCOUNTER — Ambulatory Visit
Admission: EM | Admit: 2015-03-10 | Discharge: 2015-03-10 | Disposition: A | Discharge status: Home / Self Care | Payer: Commercial Managed Care - HMO | Attending: Family Medicine | Admitting: Family Medicine

## 2015-03-10 DIAGNOSIS — N390 Urinary tract infection, site not specified: Secondary | ICD-10-CM

## 2015-03-10 NOTE — ED Provider Notes (Signed)
CSN: [id number]     Arrival date & time 03/10/15  1112 History   First MD Initiated Contact with Patient 03/10/15 1143     Chief Complaint  Patient presents with  . Follow-up   (Consider location/radiation/quality/duration/timing/severity/associated sxs/prior Treatment) HPI   The patient returns today after being seen on 03/08/2015 when she had a urinary tract infection was given Rocephin and oral medications and told to be followed up in 2 days. Returns today doing much better her daughter who accompanies her states that she has much improved and nose improvement in her ability though she tires easily. She is not eating well but is at least taking in good fluids. Her urinary symptoms have improved markedly. Plan on seeing their primary care physician  this next week.  Past Medical History  Diagnosis Date  . Hypertension   . Allergy   . Type 2 diabetes mellitus   . HLD (hyperlipidemia)   . Anxiety   . Paralysis     left arm  . Cancer     lymphoma- chemo  . Bell palsy    Past Surgical History  Procedure Laterality Date  . Vesicovaginal fistula closure w/ tah    . Appendectomy     Family History  Problem Relation Age of Onset  . Hypertension Mother   . Cancer Mother 39    Breast  . Cancer Father     Testicular  . Diabetes Sister   . Diabetes Brother    Social History  Substance Use Topics  . Smoking status: Never Smoker   . Smokeless tobacco: Never Used  . Alcohol Use: No   OB History    No data available     Review of Systems  Constitutional: Positive for activity change and appetite change. Negative for fever and chills.  All other systems reviewed and are negative.   Allergies  Amoxicillin-pot clavulanate  Home Medications   Prior to Admission medications   Medication Sig Start Date End Date Taking? Authorizing Provider  aspirin EC 81 MG tablet Take by mouth daily.    Yes Historical Provider, MD  lisinopril-hydrochlorothiazide (PRINZIDE,ZESTORETIC)  20-25 MG per tablet TAKE ONE TABLET BY MOUTH ONCE DAILY 12/13/14  Yes Historical Provider, MD  loratadine (CLARITIN) 10 MG tablet Take by mouth daily.    Yes Historical Provider, MD  lovastatin (MEVACOR) 40 MG tablet Take 80 mg by mouth at bedtime.  01/17/14  Yes Historical Provider, MD  metFORMIN (GLUCOPHAGE) 500 MG tablet Take 500 mg by mouth at bedtime.  01/10/14  Yes Historical Provider, MD  perphenazine-amitriptyline (ETRAFON/TRIAVIL) 2-25 MG TABS Take by mouth 2 (two) times daily.  07/24/14  Yes Historical Provider, MD  predniSONE (STERAPRED UNI-PAK 21 TAB) 10 MG (21) TBPK tablet Take 6 tabs first day, 5 tab next, then 4 on day 3rd, 3 tabs on day 4th , 2 tab on day 5th, and 1 tab on 6th day. 01/28/15  Yes Vaughan Basta, MD  sulfamethoxazole-trimethoprim (BACTRIM DS,SEPTRA DS) 800-160 MG per tablet Take 1 tablet by mouth 2 (two) times daily. 03/08/15  Yes William P Roemer, PA-C   BP 115/60 mmHg  Pulse 115  Temp(Src) 98.3 F (36.8 C) (Tympanic)  Resp 16  Ht 5\' 3"  (1.6 m)  Wt 141 lb (63.957 kg)  BMI 24.98 kg/m2  SpO2 98% Physical Exam  Constitutional: She is oriented to person, place, and time. She appears well-developed and well-nourished.  HENT:  Head: Normocephalic and atraumatic.  Eyes: Pupils are equal, round, and reactive  to light.  Cardiovascular: Normal rate, regular rhythm and normal heart sounds.  Exam reveals no gallop and no friction rub.   No murmur heard. Pulmonary/Chest: Breath sounds normal. No respiratory distress. She has no wheezes. She has no rales.  Abdominal: Soft. Bowel sounds are normal. She exhibits no distension. There is no tenderness. There is no rebound and no guarding.  Neurological: She is alert and oriented to person, place, and time.  Skin: Skin is warm and dry.  Psychiatric: She has a normal mood and affect. Her behavior is normal. Judgment and thought content normal.  Nursing note and vitals reviewed.   ED Course  Procedures (including critical  care time) Labs Review Labs Reviewed - No data to display  Imaging Review No results found.   MDM   1. UTI (lower urinary tract infection)    Discussed with the patient and her daughter the need to increase her intake of food and she can eat anything that she likes I prefer that she have more protein. To continue with her present course of medication and continue her fluid intake.  she needs to see her primary care physician next week for follow-up they will make the appointment.   Lorin Picket, PA-C 03/10/15 1207

## 2015-03-10 NOTE — ED Notes (Signed)
Patient follow up from 03/08/15 visit. Pt. Dx UTI.

## 2015-03-11 LAB — URINE CULTURE: Culture: >=100000

## 2015-03-24 ENCOUNTER — Inpatient Hospital Stay
Admission: EM | Admit: 2015-03-24 | Discharge: 2015-03-26 | DRG: 690 | Disposition: A | Discharge status: Skilled Nursing Facility | Payer: Commercial Managed Care - HMO | Attending: Internal Medicine | Admitting: Internal Medicine

## 2015-03-24 ENCOUNTER — Encounter: Payer: Self-pay | Admitting: Emergency Medicine

## 2015-03-24 ENCOUNTER — Emergency Department: Payer: Commercial Managed Care - HMO

## 2015-03-24 DIAGNOSIS — N3000 Acute cystitis without hematuria: Secondary | ICD-10-CM | POA: Diagnosis present

## 2015-03-24 DIAGNOSIS — D709 Neutropenia, unspecified: Secondary | ICD-10-CM

## 2015-03-24 DIAGNOSIS — E1122 Type 2 diabetes mellitus with diabetic chronic kidney disease: Secondary | ICD-10-CM | POA: Diagnosis present

## 2015-03-24 DIAGNOSIS — Z87440 Personal history of urinary (tract) infections: Secondary | ICD-10-CM | POA: Diagnosis not present

## 2015-03-24 DIAGNOSIS — N390 Urinary tract infection, site not specified: Secondary | ICD-10-CM | POA: Diagnosis not present

## 2015-03-24 DIAGNOSIS — D638 Anemia in other chronic diseases classified elsewhere: Secondary | ICD-10-CM | POA: Diagnosis present

## 2015-03-24 DIAGNOSIS — Z8249 Family history of ischemic heart disease and other diseases of the circulatory system: Secondary | ICD-10-CM | POA: Diagnosis not present

## 2015-03-24 DIAGNOSIS — E871 Hypo-osmolality and hyponatremia: Secondary | ICD-10-CM | POA: Diagnosis present

## 2015-03-24 DIAGNOSIS — B9620 Unspecified Escherichia coli [E. coli] as the cause of diseases classified elsewhere: Secondary | ICD-10-CM | POA: Diagnosis present

## 2015-03-24 DIAGNOSIS — C8310 Mantle cell lymphoma, unspecified site: Secondary | ICD-10-CM | POA: Diagnosis not present

## 2015-03-24 DIAGNOSIS — C801 Malignant (primary) neoplasm, unspecified: Secondary | ICD-10-CM

## 2015-03-24 DIAGNOSIS — Z833 Family history of diabetes mellitus: Secondary | ICD-10-CM

## 2015-03-24 DIAGNOSIS — I129 Hypertensive chronic kidney disease with stage 1 through stage 4 chronic kidney disease, or unspecified chronic kidney disease: Secondary | ICD-10-CM | POA: Diagnosis present

## 2015-03-24 DIAGNOSIS — Z7982 Long term (current) use of aspirin: Secondary | ICD-10-CM | POA: Diagnosis not present

## 2015-03-24 DIAGNOSIS — D649 Anemia, unspecified: Secondary | ICD-10-CM

## 2015-03-24 DIAGNOSIS — N189 Chronic kidney disease, unspecified: Secondary | ICD-10-CM | POA: Diagnosis present

## 2015-03-24 DIAGNOSIS — R531 Weakness: Secondary | ICD-10-CM | POA: Diagnosis not present

## 2015-03-24 DIAGNOSIS — A419 Sepsis, unspecified organism: Secondary | ICD-10-CM | POA: Diagnosis not present

## 2015-03-24 DIAGNOSIS — D72819 Decreased white blood cell count, unspecified: Secondary | ICD-10-CM

## 2015-03-24 DIAGNOSIS — E860 Dehydration: Secondary | ICD-10-CM | POA: Diagnosis present

## 2015-03-24 HISTORY — DX: Mantle cell lymphoma, unspecified site: C83.10

## 2015-03-24 LAB — CBC WITH DIFFERENTIAL/PLATELET
BAND NEUTROPHILS: 5 % (ref 0–10)
BASOS ABS: 0 10*3/uL (ref 0.0–0.1)
Basophils Relative: 0 % (ref 0–1)
Blasts: 0 %
EOS PCT: 6 % — AB (ref 0–5)
Eosinophils Absolute: 0.1 10*3/uL (ref 0.0–0.7)
HCT: 24.2 % — ABNORMAL LOW (ref 35.0–47.0)
Hemoglobin: 8.4 g/dL — ABNORMAL LOW (ref 12.0–16.0)
LYMPHS ABS: 0.5 10*3/uL — AB (ref 0.7–4.0)
Lymphocytes Relative: 20 % (ref 12–46)
MCH: 29 pg (ref 26.0–34.0)
MCHC: 34.7 g/dL (ref 32.0–36.0)
MCV: 83.6 fL (ref 80.0–100.0)
METAMYELOCYTES PCT: 2 %
MONOS PCT: 18 % — AB (ref 3–12)
MYELOCYTES: 1 %
Monocytes Absolute: 0.4 10*3/uL (ref 0.1–1.0)
NEUTROS ABS: 1.3 10*3/uL — AB (ref 1.7–7.7)
Neutrophils Relative %: 48 % (ref 43–77)
Other: 0 %
Platelets: 191 10*3/uL (ref 150–440)
Promyelocytes Absolute: 0 %
RBC: 2.9 MIL/uL — ABNORMAL LOW (ref 3.80–5.20)
RDW: 14.8 % — AB (ref 11.5–14.5)
Smear Review: ADEQUATE
WBC: 2.3 10*3/uL — ABNORMAL LOW (ref 3.6–11.0)
nRBC: 0 /100 WBC

## 2015-03-24 LAB — COMPREHENSIVE METABOLIC PANEL
ALT: 18 U/L (ref 14–54)
AST: 25 U/L (ref 15–41)
Albumin: 3.1 g/dL — ABNORMAL LOW (ref 3.5–5.0)
Alkaline Phosphatase: 121 U/L (ref 38–126)
Anion gap: 9 (ref 5–15)
BILIRUBIN TOTAL: 0.5 mg/dL (ref 0.3–1.2)
BUN: 16 mg/dL (ref 6–20)
CO2: 26 mmol/L (ref 22–32)
Calcium: 9 mg/dL (ref 8.9–10.3)
Chloride: 97 mmol/L — ABNORMAL LOW (ref 101–111)
Creatinine, Ser: 1.12 mg/dL — ABNORMAL HIGH (ref 0.44–1.00)
GFR, EST AFRICAN AMERICAN: 53 mL/min — AB (ref >60)
GFR, EST NON AFRICAN AMERICAN: 46 mL/min — AB (ref >60)
Glucose, Bld: 296 mg/dL — ABNORMAL HIGH (ref 65–99)
POTASSIUM: 3.7 mmol/L (ref 3.5–5.1)
Sodium: 132 mmol/L — ABNORMAL LOW (ref 135–145)
TOTAL PROTEIN: 6.5 g/dL (ref 6.5–8.1)

## 2015-03-24 LAB — TROPONIN I

## 2015-03-24 LAB — URINALYSIS COMPLETE WITH MICROSCOPIC (ARMC ONLY)
BILIRUBIN URINE: NEGATIVE
GLUCOSE, UA: 50 mg/dL — AB
KETONES UR: NEGATIVE mg/dL
NITRITE: NEGATIVE
Protein, ur: 100 mg/dL — AB
SPECIFIC GRAVITY, URINE: 1.015 (ref 1.005–1.030)
pH: 6 (ref 5.0–8.0)

## 2015-03-24 LAB — BRAIN NATRIURETIC PEPTIDE: B Natriuretic Peptide: 80 pg/mL (ref 0.0–100.0)

## 2015-03-24 LAB — FERRITIN: Ferritin: 146 ng/mL (ref 11–307)

## 2015-03-24 LAB — LACTIC ACID, PLASMA
Lactic Acid, Venous: 1.1 mmol/L (ref 0.5–2.0)
Lactic Acid, Venous: 1.2 mmol/L (ref 0.5–2.0)

## 2015-03-24 LAB — FOLATE: Folate: 11.5 ng/mL (ref >5.9)

## 2015-03-24 LAB — RETICULOCYTES
RBC.: 2.96 MIL/uL — AB (ref 3.80–5.20)
RETIC CT PCT: 3.9 % — AB (ref 0.4–3.1)
Retic Count, Absolute: 115.4 10*3/uL (ref 19.0–183.0)

## 2015-03-24 LAB — GLUCOSE, CAPILLARY: Glucose-Capillary: 216 mg/dL — ABNORMAL HIGH (ref 65–99)

## 2015-03-24 LAB — IRON AND TIBC
Iron: 27 ug/dL — ABNORMAL LOW (ref 28–170)
SATURATION RATIOS: 7 % — AB (ref 10.4–31.8)
TIBC: 363 ug/dL (ref 250–450)
UIBC: 336 ug/dL

## 2015-03-24 MED ORDER — PRAVASTATIN SODIUM 10 MG PO TABS
10.0000 mg | ORAL_TABLET | Freq: Every day | ORAL | Status: DC
  Administered 2015-03-24 – 2015-03-26 (×3): 10 mg via ORAL
  Filled 2015-03-24 (×3): qty 1

## 2015-03-24 MED ORDER — LEVOFLOXACIN IN D5W 750 MG/150ML IV SOLN
750.0000 mg | Freq: Once | INTRAVENOUS | Status: AC
  Administered 2015-03-24: 750 mg via INTRAVENOUS
  Filled 2015-03-24: qty 150

## 2015-03-24 MED ORDER — ACETAMINOPHEN 325 MG PO TABS: 650.0000 mg | ORAL_TABLET | Freq: Four times a day (QID) | ORAL | Status: DC | PRN

## 2015-03-24 MED ORDER — INFLUENZA VAC SPLIT QUAD 0.5 ML IM SUSY: 0.5000 mL | PREFILLED_SYRINGE | INTRAMUSCULAR | Status: DC

## 2015-03-24 MED ORDER — SENNOSIDES-DOCUSATE SODIUM 8.6-50 MG PO TABS
1.0000 | ORAL_TABLET | Freq: Every evening | ORAL | Status: DC | PRN
  Administered 2015-03-25: 11:00:00 1 via ORAL
  Filled 2015-03-24: qty 1

## 2015-03-24 MED ORDER — ASPIRIN 81 MG PO CHEW
81.0000 mg | CHEWABLE_TABLET | Freq: Every day | ORAL | Status: DC
  Administered 2015-03-25 – 2015-03-26 (×2): 81 mg via ORAL
  Filled 2015-03-24 (×2): qty 1

## 2015-03-24 MED ORDER — ALUM & MAG HYDROXIDE-SIMETH 200-200-20 MG/5ML PO SUSP: 30.0000 mL | Freq: Four times a day (QID) | ORAL | Status: DC | PRN

## 2015-03-24 MED ORDER — ENOXAPARIN SODIUM 40 MG/0.4ML ~~LOC~~ SOLN
40.0000 mg | SUBCUTANEOUS | Status: DC
  Administered 2015-03-24 – 2015-03-25 (×2): 40 mg via SUBCUTANEOUS
  Filled 2015-03-24 (×2): qty 0.4

## 2015-03-24 MED ORDER — IOHEXOL 300 MG/ML  SOLN
100.0000 mL | Freq: Once | INTRAMUSCULAR | Status: AC | PRN
  Administered 2015-03-24: 100 mL via INTRAVENOUS

## 2015-03-24 MED ORDER — SODIUM CHLORIDE 0.9 % IV SOLN
INTRAVENOUS | Status: DC
  Administered 2015-03-24 – 2015-03-25 (×2): via INTRAVENOUS

## 2015-03-24 MED ORDER — AMITRIPTYLINE HCL 25 MG PO TABS
25.0000 mg | ORAL_TABLET | Freq: Two times a day (BID) | ORAL | Status: DC
  Administered 2015-03-24 – 2015-03-26 (×4): 25 mg via ORAL
  Filled 2015-03-24 (×5): qty 1

## 2015-03-24 MED ORDER — HYDROCODONE-ACETAMINOPHEN 5-325 MG PO TABS: 1.0000 | ORAL_TABLET | ORAL | Status: DC | PRN

## 2015-03-24 MED ORDER — ONDANSETRON HCL 4 MG PO TABS: 4.0000 mg | ORAL_TABLET | Freq: Four times a day (QID) | ORAL | Status: DC | PRN

## 2015-03-24 MED ORDER — ACETAMINOPHEN 650 MG RE SUPP: 650.0000 mg | Freq: Four times a day (QID) | RECTAL | Status: DC | PRN

## 2015-03-24 MED ORDER — INSULIN ASPART 100 UNIT/ML ~~LOC~~ SOLN
0.0000 [IU] | Freq: Three times a day (TID) | SUBCUTANEOUS | Status: DC
  Administered 2015-03-25: 1 [IU] via SUBCUTANEOUS
  Administered 2015-03-25: 3 [IU] via SUBCUTANEOUS
  Administered 2015-03-26 (×3): 2 [IU] via SUBCUTANEOUS
  Filled 2015-03-24 (×3): qty 2
  Filled 2015-03-24: qty 1
  Filled 2015-03-24: qty 3

## 2015-03-24 MED ORDER — ONDANSETRON HCL 4 MG/2ML IJ SOLN: 4.0000 mg | Freq: Four times a day (QID) | INTRAMUSCULAR | Status: DC | PRN

## 2015-03-24 MED ORDER — DEXTROSE 5 % IV SOLN
1.0000 g | INTRAVENOUS | Status: DC
  Administered 2015-03-24 – 2015-03-25 (×2): 1 g via INTRAVENOUS
  Filled 2015-03-24 (×3): qty 10

## 2015-03-24 MED ORDER — VITAMIN D 1000 UNITS PO TABS
1000.0000 [IU] | ORAL_TABLET | Freq: Every day | ORAL | Status: DC
  Administered 2015-03-25 – 2015-03-26 (×2): 1000 [IU] via ORAL
  Filled 2015-03-24 (×4): qty 1

## 2015-03-24 MED ORDER — LISINOPRIL 20 MG PO TABS
20.0000 mg | ORAL_TABLET | Freq: Every day | ORAL | Status: DC
  Administered 2015-03-25 – 2015-03-26 (×2): 20 mg via ORAL
  Filled 2015-03-24 (×2): qty 1

## 2015-03-24 MED ORDER — PERPHENAZINE-AMITRIPTYLINE 2-25 MG PO TABS
1.0000 | ORAL_TABLET | Freq: Two times a day (BID) | ORAL | Status: DC
  Filled 2015-03-24: qty 1

## 2015-03-24 MED ORDER — B COMPLEX-C PO TABS
1.0000 | ORAL_TABLET | ORAL | Status: DC
  Administered 2015-03-25: 1 via ORAL
  Filled 2015-03-24 (×4): qty 1

## 2015-03-24 MED ORDER — LISINOPRIL-HYDROCHLOROTHIAZIDE 20-25 MG PO TABS: 1.0000 | ORAL_TABLET | Freq: Every day | ORAL | Status: DC

## 2015-03-24 MED ORDER — PERPHENAZINE 2 MG PO TABS
2.0000 mg | ORAL_TABLET | Freq: Two times a day (BID) | ORAL | Status: DC
  Administered 2015-03-24 – 2015-03-26 (×4): 2 mg via ORAL
  Filled 2015-03-24 (×5): qty 1

## 2015-03-24 MED ORDER — SODIUM CHLORIDE 0.9 % IV SOLN
Freq: Once | INTRAVENOUS | Status: AC
  Administered 2015-03-24: 13:00:00 via INTRAVENOUS

## 2015-03-24 MED ORDER — SODIUM CHLORIDE 0.9 % IJ SOLN
3.0000 mL | Freq: Two times a day (BID) | INTRAMUSCULAR | Status: DC
  Administered 2015-03-24 – 2015-03-25 (×2): 3 mL via INTRAVENOUS

## 2015-03-24 MED ORDER — INSULIN ASPART 100 UNIT/ML ~~LOC~~ SOLN
0.0000 [IU] | Freq: Every day | SUBCUTANEOUS | Status: DC
  Administered 2015-03-24: 22:00:00 2 [IU] via SUBCUTANEOUS
  Filled 2015-03-24: qty 2

## 2015-03-24 MED ORDER — DOCUSATE SODIUM 100 MG PO CAPS
300.0000 mg | ORAL_CAPSULE | ORAL | Status: DC
  Administered 2015-03-25 – 2015-03-26 (×2): 300 mg via ORAL
  Filled 2015-03-24 (×2): qty 3

## 2015-03-24 MED ORDER — HYDROCHLOROTHIAZIDE 25 MG PO TABS
25.0000 mg | ORAL_TABLET | Freq: Every day | ORAL | Status: DC
  Administered 2015-03-25 – 2015-03-26 (×2): 25 mg via ORAL
  Filled 2015-03-24 (×2): qty 1

## 2015-03-24 MED ORDER — LORATADINE 10 MG PO TABS
10.0000 mg | ORAL_TABLET | Freq: Every day | ORAL | Status: DC
  Administered 2015-03-24 – 2015-03-25 (×2): 10 mg via ORAL
  Filled 2015-03-24 (×2): qty 1

## 2015-03-24 NOTE — Plan of Care (Signed)
Problem: Discharge Progression Outcomes Goal: Pain controlled with appropriate interventions Outcome: Progressing No pain at present  Goal: Hemodynamically stable Outcome: Not Progressing See hx.  See ct scan results from today Goal: Complications resolved/controlled Outcome: Not Progressing abx  In use iv. ivfs infusinf. Goal: Tolerating diet Outcome: Progressing tol diet. Goal: Activity appropriate for discharge plan Outcome: Not Progressing Need assist to be oob. Pt fell at home prior to admission. No injury. High fall risk.

## 2015-03-24 NOTE — H&P (Signed)
[facility] [place] NAME: Anna Leonard    MR#:  [id number]  DATE OF BIRTH:  Jun 01, 1936  DATE OF ADMISSION:  03/24/2015  PRIMARY CARE PHYSICIAN: [facility], Chrissie Noa, MD   REQUESTING/REFERRING PHYSICIAN: Dr. Cinda Quest  CHIEF COMPLAINT:  Urine tract infection  HISTORY OF PRESENT ILLNESS:  Anna Leonard  is a 79 y.o. female with a known history of Mantle cell lymphoma, diabetes hypertension and recent Escherichia coli urinary tract infection presents with dysuria and weakness. Patient was treated for Escherichia coli urinary tract infection on Bactrim on 03/08/2015. She presents today due to dysuria and weakness. Over the past week she's had these symptoms along with decreased by mouth intake. In the emergency room she is noted to have a urinary tract infection. She is also noted to have tachycardia and leukopenia. CT scan was performed in the emergency department which shows recurrence of her lymphoma.  PAST MEDICAL HISTORY:   Past Medical History  Diagnosis Date  . Hypertension   . Allergy   . Type 2 diabetes mellitus   . HLD (hyperlipidemia)   . Anxiety   . Paralysis     left arm  . Cancer     lymphoma- chemo  . Bell palsy   . Mantle cell lymphoma     PAST SURGICAL HISTORY:   Past Surgical History  Procedure Laterality Date  . Vesicovaginal fistula closure w/ tah    . Appendectomy    . Abdominal hysterectomy      SOCIAL HISTORY:   Social History  Substance Use Topics  . Smoking status: Never Smoker   . Smokeless tobacco: Never Used  . Alcohol Use: No    FAMILY HISTORY:   Family History  Problem Relation Age of Onset  . Hypertension Mother   . Cancer Mother 81    Breast  . Cancer Father     Testicular  . Diabetes Sister   . Diabetes Brother     DRUG ALLERGIES:   Allergies  Allergen Reactions  . Amoxicillin-Pot Clavulanate Other (See Comments)    diarrhea     REVIEW OF SYSTEMS:  CONSTITUTIONAL: ++ low  grade fever, fatigue and weakness.  EYES: No blurred or double vision.  EARS, NOSE, AND THROAT: No tinnitus or ear pain.  RESPIRATORY: No cough, shortness of breath, wheezing or hemoptysis.  CARDIOVASCULAR: No chest pain, orthopnea, edema.  GASTROINTESTINAL: No nausea, vomiting, diarrhea or abdominal pain.  GENITOURINARY: ++dysuria, NO hematuria.  ENDOCRINE: No polyuria, nocturia,  HEMATOLOGY: No anemia, easy bruising or bleeding SKIN: No rash or lesion. MUSCULOSKELETAL: No joint pain or arthritis.   NEUROLOGIC: No tingling, numbness, ++ generalized weakness.  PSYCHIATRY: No anxiety or depression.   MEDICATIONS AT HOME:   Prior to Admission medications   Medication Sig Start Date End Date Taking? Authorizing Provider  aspirin 81 MG chewable tablet Chew 81 mg by mouth daily.   Yes Historical Provider, MD  B Complex-C (B-COMPLEX WITH VITAMIN C) tablet Take 1 tablet by mouth every morning.   Yes Historical Provider, MD  Cholecalciferol 1000 UNITS capsule Take 1,000 Units by mouth daily.   Yes Historical Provider, MD  docusate sodium (COLACE) 100 MG capsule Take 300 mg by mouth every morning.   Yes Historical Provider, MD  lisinopril-hydrochlorothiazide (PRINZIDE,ZESTORETIC) 20-25 MG per tablet TAKE ONE TABLET BY MOUTH ONCE DAILY 12/13/14  Yes Historical Provider, MD  loratadine (CLARITIN) 10 MG tablet Take 10 mg by mouth daily.   Yes Historical  Provider, MD  lovastatin (MEVACOR) 40 MG tablet Take 80 mg by mouth at bedtime.  01/17/14  Yes Historical Provider, MD  metFORMIN (GLUCOPHAGE) 500 MG tablet Take 500 mg by mouth every evening.  01/10/14  Yes Historical Provider, MD  perphenazine-amitriptyline (ETRAFON/TRIAVIL) 2-25 MG TABS Take 1 tablet by mouth 2 (two) times daily.  07/24/14  Yes Historical Provider, MD  predniSONE (STERAPRED UNI-PAK 21 TAB) 10 MG (21) TBPK tablet Take 6 tabs first day, 5 tab next, then 4 on day 3rd, 3 tabs on day 4th , 2 tab on day 5th, and 1 tab on 6th day. 01/28/15    Vaughan Basta, MD  sulfamethoxazole-trimethoprim (BACTRIM DS,SEPTRA DS) 800-160 MG per tablet Take 1 tablet by mouth 2 (two) times daily. 03/08/15   Lorin Picket, PA-C      VITAL SIGNS:  Blood pressure 127/70, pulse 124, temperature 98 F (36.7 C), temperature source Oral, resp. rate 31, height 5\' 2"  (1.575 m), weight 72 kg (158 lb 11.7 oz), SpO2 97 %.  PHYSICAL EXAMINATION:  GENERAL:  79 y.o.-year-old patient lying in the bed with no acute distress.  EYES: Pupils equal, round, reactive to light  No scleral icterus.. Bell's palsy old left side HEENT: Head atraumatic, normocephalic. Oropharynx and nasopharynx clear.  NECK:  Supple, no jugular venous distention. No thyroid enlargement, no tenderness.  LUNGS: Normal breath sounds bilaterally, no wheezing, rales,rhonchi or crepitation. No use of accessory muscles of respiration.  CARDIOVASCULAR: S1, S2 normal. No murmurs, rubs, or gallops.  ABDOMEN: Soft, nontender, nondistended. Bowel sounds present. No organomegaly or mass.  EXTREMITIES: No pedal edema, cyanosis, or clubbing. Left arm weakness which is chronic NEUROLOGIC: Cranial nerves II through XII are grossly intact. No focal deficits. PSYCHIATRIC: The patient is alert and oriented x 3.  SKIN: No obvious rash, lesion, or ulcer.   LABORATORY PANEL:   CBC  Recent Labs Lab 03/24/15 1250  WBC 2.3*  HGB 8.4*  HCT 24.2*  PLT 191   ------------------------------------------------------------------------------------------------------------------  Chemistries   Recent Labs Lab 03/24/15 1250  NA 132*  K 3.7  CL 97*  CO2 26  GLUCOSE 296*  BUN 16  CREATININE 1.12*  CALCIUM 9.0  AST 25  ALT 18  ALKPHOS 121  BILITOT 0.5   ------------------------------------------------------------------------------------------------------------------  Cardiac Enzymes  Recent Labs Lab 03/24/15 1250  TROPONINI <0.03    ------------------------------------------------------------------------------------------------------------------  RADIOLOGY:  Ct Head Wo Contrast  03/24/2015 MPRESSION: No acute intracranial pathology.  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   Electronically Signed   By: Kathreen Devoid   On: 03/24/2015 14:10   Ct Abdomen Pelvis W Contrast    IMPRESSION: 1. The overall appearance of the abdomen/pelvis is most consistent with severe recurrence of lymphoma as described above. 2. There are multiple hypodense masses throughout bilateral kidneys most concerning for lymphomatous involvement. 3. There is increased soft tissue density within the right L5-S1 neural foramen and extending into the epidural space likely reflecting lymphoma recurrence. 4. Cholelithiasis.   Electronically Signed   By: Kathreen Devoid   On: 03/24/2015 14:35    EKG:  Sinus tachycardia with PACs  IMPRESSION AND PLAN:  79 year old female with history of mantle cell lymphoma and diabetes who presents with dysuria and weakness found to have urinary tract infection and sepsis.   1. Sepsis: With tachycardia and leukopenia present on admission. This is likely secondary to tract infection. Urine and blood culture been ordered in the emergency room. She was started on Levaquin however I will change this to  Rocephin. Her last urine culture was positive for Escherichia coli which was pansensitive except for amoxicillin.  2 urinary tract infection: Patient has no evidence of pyelonephritis. Rocephin has been ordered. Urine cultures been ordered as well. This is her second urinary tract infection in the past month. She may benefit from an outpatient urology workup.  3. Mantle cell lymphoma: It appears that she may have recurrence of her mantle cell lymphoma. She has been seen by Dr. Maryjane Hurter in the past and I will consult him for further evaluation and management.  4. Anemia: This is of chronic disease. Anemia panel has been ordered.  Continue to monitor CBC.  5. Hyponatremia: This is secondary dehydration. IV fluids have been started and we will repeat a BMP in a.m.  6. Sinus tachycardia: This is likely secondary sepsis. Continue to monitor on telemetry.  7. Diabetes type 2: Continue ADA diet and sliding scale insulin.  8. Leukopenia: from sepsis?? Oncology evaluation requested.  All the records are reviewed and case discussed with ED provider. Management plans discussed with the patient and she is in agreement.  CODE STATUS: FULL  TOTAL TIME TAKING CARE OF THIS PATIENT: 45 minutes.    Erienne Spelman M.D on 03/24/2015 at 3:13 PM  Between 7am to 6pm - Pager - [phone] After 6pm go to [email or web address] - password [email or web address] [facility]  [facility]  Office  [phone]  CC: Primary care physician; [facility], Chrissie Noa, MD
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
## 2015-03-24 NOTE — ED Notes (Signed)
Pts family reports pt was treated for UTI recently, burning with urination. Pt with marked left arm weakness, hx of bells palsy for left sided facial droop.

## 2015-03-25 DIAGNOSIS — C8310 Mantle cell lymphoma, unspecified site: Secondary | ICD-10-CM

## 2015-03-25 DIAGNOSIS — R531 Weakness: Secondary | ICD-10-CM

## 2015-03-25 DIAGNOSIS — N390 Urinary tract infection, site not specified: Secondary | ICD-10-CM

## 2015-03-25 DIAGNOSIS — A419 Sepsis, unspecified organism: Secondary | ICD-10-CM

## 2015-03-25 LAB — GLUCOSE, CAPILLARY
GLUCOSE-CAPILLARY: 142 mg/dL — AB (ref 65–99)
GLUCOSE-CAPILLARY: 194 mg/dL — AB (ref 65–99)
Glucose-Capillary: 180 mg/dL — ABNORMAL HIGH (ref 65–99)
Glucose-Capillary: 229 mg/dL — ABNORMAL HIGH (ref 65–99)

## 2015-03-25 LAB — CBC
HEMATOCRIT: 23.9 % — AB (ref 35.0–47.0)
HEMOGLOBIN: 8.2 g/dL — AB (ref 12.0–16.0)
MCH: 29 pg (ref 26.0–34.0)
MCHC: 34.4 g/dL (ref 32.0–36.0)
MCV: 84.4 fL (ref 80.0–100.0)
Platelets: 179 10*3/uL (ref 150–440)
RBC: 2.84 MIL/uL — AB (ref 3.80–5.20)
RDW: 15.2 % — ABNORMAL HIGH (ref 11.5–14.5)
WBC: 2.5 10*3/uL — AB (ref 3.6–11.0)

## 2015-03-25 LAB — VITAMIN B12: Vitamin B-12: 396 pg/mL (ref 180–914)

## 2015-03-25 LAB — BASIC METABOLIC PANEL
ANION GAP: 10 (ref 5–15)
BUN: 17 mg/dL (ref 6–20)
CHLORIDE: 100 mmol/L — AB (ref 101–111)
CO2: 24 mmol/L (ref 22–32)
Calcium: 9.1 mg/dL (ref 8.9–10.3)
Creatinine, Ser: 1.22 mg/dL — ABNORMAL HIGH (ref 0.44–1.00)
GFR calc non Af Amer: 42 mL/min — ABNORMAL LOW (ref >60)
GFR, EST AFRICAN AMERICAN: 48 mL/min — AB (ref >60)
Glucose, Bld: 194 mg/dL — ABNORMAL HIGH (ref 65–99)
POTASSIUM: 3.7 mmol/L (ref 3.5–5.1)
SODIUM: 134 mmol/L — AB (ref 135–145)

## 2015-03-25 LAB — HEMOGLOBIN A1C: Hgb A1c MFr Bld: 8.2 % — ABNORMAL HIGH (ref 4.0–6.0)

## 2015-03-25 NOTE — Progress Notes (Signed)
[facility] [place] NAME: Anna Leonard    MR#:  [id number]  DATE OF BIRTH:  13-Nov-1935  SUBJECTIVE:  CHIEF COMPLAINT:   Chief Complaint  Patient presents with  . Urinary Frequency  . Extremity Weakness   patient is 79 year old Caucasian female who presents to the hospital with urinary frequency as well as weakness and was noted to have UTI. She was initiated on levofloxacin. She feels good today. Denies any pain. Blood cultures are negative so far. Urine culture is pending CT scan in the emergency room revealed hypodense masses throughout bilateral kidneys, and other masses in the abdomen, concerning with lymphoma recurrence, oncology consultation is requested. Patient's labs revealed renal insufficiency and leukopenia as well as anemia. Patient denies any fevers or chills.  Review of Systems  Constitutional: Negative for fever, chills and weight loss.  HENT: Negative for congestion.   Eyes: Negative for blurred vision and double vision.  Respiratory: Negative for cough, sputum production, shortness of breath and wheezing.   Cardiovascular: Negative for chest pain, palpitations, orthopnea, leg swelling and PND.  Gastrointestinal: Negative for nausea, vomiting, abdominal pain, diarrhea, constipation and blood in stool.  Genitourinary: Negative for dysuria, urgency, frequency and hematuria.  Musculoskeletal: Negative for falls.  Neurological: Negative for dizziness, tremors, focal weakness and headaches.  Endo/Heme/Allergies: Does not bruise/bleed easily.  Psychiatric/Behavioral: Negative for depression. The patient does not have insomnia.     VITAL SIGNS: Blood pressure 130/49, pulse 120, temperature 98.8 F (37.1 C), temperature source Oral, resp. rate 8, height 5\' 2"  (1.575 m), weight 67.132 kg (148 lb), SpO2 96 %.  PHYSICAL EXAMINATION:   GENERAL:  79 y.o.-year-old patient lying in the bed with no acute distress.  EYES: Pupils  equal, round, reactive to light and accommodation. No scleral icterus. Extraocular muscles intact.  HEENT: Head atraumatic, normocephalic. Oropharynx and nasopharynx clear.  NECK:  Supple, no jugular venous distention. No thyroid enlargement, no tenderness.  LUNGS: Normal breath sounds bilaterally, no wheezing, rales,rhonchi or crepitation. No use of accessory muscles of respiration.  CARDIOVASCULAR: S1, S2 normal. No murmurs, rubs, or gallops.  ABDOMEN: Soft, nontender, nondistended. Bowel sounds present. No organomegaly or mass.  EXTREMITIES: No pedal edema, cyanosis, or clubbing.  NEUROLOGIC: Cranial nerves II through XII are intact. Muscle strength 5/5 in all extremities. Sensation intact. Gait not checked.  PSYCHIATRIC: The patient is alert and oriented x 3.  SKIN: No obvious rash, lesion, or ulcer.   ORDERS/RESULTS REVIEWED:   CBC  Recent Labs Lab 03/24/15 1250 03/25/15 0435  WBC 2.3* 2.5*  HGB 8.4* 8.2*  HCT 24.2* 23.9*  PLT 191 179  MCV 83.6 84.4  MCH 29.0 29.0  MCHC 34.7 34.4  RDW 14.8* 15.2*  LYMPHSABS 0.5*  --   MONOABS 0.4  --   EOSABS 0.1  --   BASOSABS 0.0  --    ------------------------------------------------------------------------------------------------------------------  Chemistries   Recent Labs Lab 03/24/15 1250 03/25/15 0435  NA 132* 134*  K 3.7 3.7  CL 97* 100*  CO2 26 24  GLUCOSE 296* 194*  BUN 16 17  CREATININE 1.12* 1.22*  CALCIUM 9.0 9.1  AST 25  --   ALT 18  --   ALKPHOS 121  --   BILITOT 0.5  --    ------------------------------------------------------------------------------------------------------------------ estimated creatinine clearance is 34.7 mL/min (by C-G formula based on Cr of 1.22). ------------------------------------------------------------------------------------------------------------------ No results for input(s): TSH, T4TOTAL, T3FREE, THYROIDAB in the last 72 hours.  Invalid input(s): FREET3  Cardiac  Enzymes  Recent Labs Lab 03/24/15 1250  TROPONINI <0.03   ------------------------------------------------------------------------------------------------------------------ Invalid input(s): POCBNP ---------------------------------------------------------------------------------------------------------------  RADIOLOGY: Ct Head Wo Contrast  03/24/2015   CLINICAL DATA:  Weakness, fever, burning with urination for 2-3 weeks.  EXAM: CT HEAD WITHOUT CONTRAST  TECHNIQUE: Contiguous axial images were obtained from the base of the skull through the vertex without intravenous contrast.  COMPARISON:  01/27/2015  FINDINGS: There is no evidence of mass effect, midline shift, or extra-axial fluid collections. There is no evidence of a space-occupying lesion or intracranial hemorrhage. There is no evidence of a cortical-based area of acute infarction. There is an old right frontoparietal encephalomalacia. There is periventricular white matter low attenuation likely secondary to microangiopathy.  The ventricles and sulci are appropriate for the patient's age. The basal cisterns are patent.  Visualized portions of the orbits are unremarkable. The visualized portions of the paranasal sinuses and mastoid air cells are unremarkable. Cerebrovascular atherosclerotic calcifications are noted.  There is evidence of prior right frontoparietal craniectomy. There is no acute osseous abnormality.  IMPRESSION: No acute intracranial pathology.  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   Electronically Signed   By: Kathreen Devoid   On: 03/24/2015 14:10   Ct Abdomen Pelvis W Contrast  03/24/2015   CLINICAL DATA:  Fever, weakness, burning with urination for 2-3 weeks. History of lymphoma.  EXAM: CT ABDOMEN AND PELVIS WITH CONTRAST  TECHNIQUE: Multidetector CT imaging of the abdomen and pelvis was performed using the standard protocol following bolus administration of intravenous contrast.  CONTRAST:  137mL OMNIPAQUE IOHEXOL 300 MG/ML   SOLN  COMPARISON:  PET-CT 10/15/2014  FINDINGS: Lower chest: Mild chronic interstitial disease at the left lung base. No pleural effusion. Normal heart size.  Hepatobiliary: Normal liver. Gallbladder filled with cholelithiasis. Gallbladder is otherwise normal.  Pancreas: Normal pancreas.  Spleen: Normal spleen.  Adrenals/Urinary Tract: Normal adrenal glands. No obstructive uropathy. No nephrolithiasis. There are numerous hypodense masses throughout bilateral kidneys with the largest in the right kidney measuring 2.4 cm and the largest in the left kidney measuring 3.6 cm. There is circumferential bladder wall thickening.  Stomach/Bowel: No bowel wall thickening or dilatation. No bowel obstruction. No pneumatosis, pneumoperitoneum or portal venous gas. No abdominal pelvic free fluid. There is a small fat containing umbilical hernia.  Vascular/Lymphatic: Normal caliber abdominal aorta. Large retroperitoneal soft tissue mass measuring approximately 9.1 x 6.7 x 11.2 cm encasing the distal portion of the abdominal aorta and common iliac arteries as well as the IVC. There is a large presacral soft tissue mass measuring 8.3 x 5.3 cm encasing the right S3 nerve root. There is a left cardiophrenic mass measuring 1.3 x 3.4 cm most consistent with an enlarged lymph node.  Reproductive: Prior hysterectomy.  No adnexal mass.  Other: There is a 7 x 3.9 cm soft tissue mass in the subcutaneous fat of the left lower back.  Musculoskeletal: There is expansion of the left iliacus muscle, left gluteus maximus muscle and left gluteus medius muscle. There is no aggressive lytic or sclerotic osseous lesion. There is an ununited insufficiency fracture of the right inferior pubic ramus and right pubic symphysis. There is increased soft tissue density within the right L5-S1 neural foramen and extending into the epidural space.  IMPRESSION: 1. The overall appearance of the abdomen/pelvis is most consistent with severe recurrence of lymphoma  as described above. 2. There are multiple hypodense masses throughout bilateral kidneys most concerning for lymphomatous involvement. 3. There is increased soft tissue density within  the right L5-S1 neural foramen and extending into the epidural space likely reflecting lymphoma recurrence. 4. Cholelithiasis.   Electronically Signed   By: Kathreen Devoid   On: 03/24/2015 14:35    EKG:  Orders placed or performed during the hospital encounter of 03/24/15  . EKG 12-Lead  . EKG 12-Lead    ASSESSMENT AND PLAN:  Active Problems:   Sepsis  1 sepsis due to acute cystitis, continue levofloxacin. Blood cultures 2 are negative so far. Urine cultures are pending. Adjust antibiotics depending on culture results. 2. Neutropenia. Get oncologist involved for further recommendations. 3. Acute cystitis without hematuria, getting urine cultures. Continue antibiotic therapy for now 4. Renal insufficiency. Unclear if chronic. Patient's kidney function deteriorated in August 2016, very likely related to recurrence of lymphoma, following along. No hydronephrosis on CT scan.  5. Recurrent mantle cell lymphoma, getting oncologist involved for further recommendations 6. Hyponatremia seems to be better with IV fluid administration   Management plans discussed with the patient, family and they are in agreement.   DRUG ALLERGIES:  Allergies  Allergen Reactions  . Amoxicillin-Pot Clavulanate Other (See Comments)    diarrhea    CODE STATUS:     Code Status Orders        Start     Ordered   03/24/15 1712  Full code   Continuous     03/24/15 1712      TOTAL TIME TAKING CARE OF THIS PATIENT: 40 minutes.    Theodoro Grist M.D on 03/25/2015 at 9:25 AM  Between 7am to 6pm - Pager - [phone]  After 6pm go to [email or web address] - password [email or web address] [facility]  [facility]  Office  [phone]  CC: Primary care physician; [facility], Chrissie Noa, MD

## 2015-03-25 NOTE — Clinical Social Work Placement (Signed)
   CLINICAL SOCIAL WORK PLACEMENT  NOTE  Date:  03/25/2015  Patient Details  Name: Anna Leonard MRN: [id number] Date of Birth: 10/19/1935  Clinical Social Work is seeking post-discharge placement for this patient at the [place] level of care (*CSW will initial, date and re-position this form in  chart as items are completed):  Yes   Patient/family provided with [facility] Work Department's list of facilities offering this level of care within the geographic area requested by the patient (or if unable, by the patient's family).  Yes   Patient/family informed of their freedom to choose among providers that offer the needed level of care, that participate in Medicare, Medicaid or managed care program needed by the patient, have an available bed and are willing to accept the patient.  Yes   Patient/family informed of [facility]'s ownership interest in [facility] and [facility], as well as of the fact that they are under no obligation to receive care at these facilities.  PASRR submitted to EDS on 03/25/15     PASRR number received on 03/25/15     Existing PASRR number confirmed on       FL2 transmitted to all facilities in geographic area requested by pt/family on 03/25/15     FL2 transmitted to all facilities within larger geographic area on       Patient informed that his/her managed care company has contracts with or will negotiate with certain facilities, including the following:            Patient/family informed of bed offers received.  Patient chooses bed at       Physician recommends and patient chooses bed at      Patient to be transferred to   on  .  Patient to be transferred to facility by       Patient family notified on   of transfer.  Name of family member notified:        PHYSICIAN Please sign FL2     Additional Comment:   CSW will f/u with offers once they have been  received _______________________________________________ Mathews Argyle, LCSW 03/25/2015, 3:42 PM

## 2015-03-25 NOTE — Plan of Care (Signed)
Problem: Discharge Progression Outcomes Goal: Other Discharge Outcomes/Goals Plan of care progress to goal: Pain: denies pain Hemodynamically: VSS Diet: good appetite Activity: one assist up to bathroom

## 2015-03-25 NOTE — Evaluation (Signed)
Physical Therapy Evaluation Patient Details Name: Anna Leonard MRN: [id number] DOB: 01/25/1936 Today's Date: 03/25/2015   History of Present Illness  Pt here with UTI  Clinical Impression  Pt is a 79 y/o female here with UTI, general weakness and was found to have recurrent lymphoma return.  She is eager to work with PT, but was very fatigued and to her surprise with unable to really even take a step.  She agrees that at this time she needs rehab.  Pt will need a SPC as her L UE is not able to use a walker appropriately.      Follow Up Recommendations SNF    Equipment Recommendations       Recommendations for Other Services       Precautions / Restrictions Precautions Precautions: Fall Restrictions Weight Bearing Restrictions: No      Mobility  Bed Mobility Overal bed mobility: Needs Assistance Bed Mobility: Supine to Sit;Sit to Supine     Supine to sit: Min assist Sit to supine: Mod assist   General bed mobility comments: Pt shows good effort but needs assist to get to EOB and to get legs back into bed after standing attempts  Transfers Overall transfer level: Needs assistance Equipment used:  (HHA with R UE) Transfers: Sit to/from Stand Sit to Stand: Mod assist         General transfer comment: Pt fearful of falling and shows poor confidence with 2X attempts at standing  Ambulation/Gait             General Gait Details: Pt eager to try to walk, but is unable to initiate more than a small side step on EOB.  She is very weak and also fearful during 2 attempts despite much encouragement and motivation  Stairs            Wheelchair Mobility    Modified Rankin (Stroke Patients Only)       Balance                                             Pertinent Vitals/Pain Pain Assessment: No/denies pain    Home Living Family/patient expects to be discharged to:: Private residence Living Arrangements: Children (he works during  the day and she is alone) Available Help at Discharge: Family Type of Home: House Home Access: Stairs to enter   Technical brewer of Steps: 3          Prior Function Level of Independence: Needs assistance   Gait / Transfers Assistance Needed: Pt reports that she is able to do in home ambulation (sometimes with a cane) but that she needs a lot of assist on the stairs and generally does not do much out of the house           Hand Dominance        Extremity/Trunk Assessment   Upper Extremity Assessment: LUE deficits/detail (minimal AROM from injury at age 73)           Lower Extremity Assessment: Generalized weakness         Communication   Communication: No difficulties  Cognition Arousal/Alertness: Awake/alert Behavior During Therapy: WFL for tasks assessed/performed Overall Cognitive Status: Within Functional Limits for tasks assessed                      General Comments  Exercises        Assessment/Plan    PT Assessment Patient needs continued PT services  PT Diagnosis Difficulty walking;Generalized weakness   PT Problem List Decreased strength;Decreased range of motion;Decreased activity tolerance;Decreased balance;Decreased mobility;Decreased coordination;Decreased cognition;Decreased knowledge of use of DME;Decreased safety awareness  PT Treatment Interventions DME instruction;Gait training;Stair training;Functional mobility training;Therapeutic activities;Balance training;Therapeutic exercise;Neuromuscular re-education   PT Goals (Current goals can be found in the Care Plan section) Acute Rehab PT Goals Patient Stated Goal: Pt wanting to get stronger, more independent PT Goal Formulation: With patient Time For Goal Achievement: 04/08/15 Potential to Achieve Goals: Fair    Frequency Min 2X/week   Barriers to discharge        Co-evaluation               End of Session     Patient left: with bed alarm set Nurse  Communication: Mobility status         Time: 1740-8144 PT Time Calculation (min) (ACUTE ONLY): 28 min   Charges:   PT Evaluation $Initial PT Evaluation Tier I: 1 Procedure     PT G Codes:       Wayne Both, PT, DPT [phone] Kreg Shropshire 03/25/2015, 2:31 PM

## 2015-03-25 NOTE — Care Management Important Message (Signed)
Important Message  Patient Details  Name: Anna Leonard MRN: [id number] Date of Birth: 11/20/1935   Medicare Important Message Given:  Yes-second notification given    Shelbie Ammons, RN 03/25/2015, 11:40 AM

## 2015-03-25 NOTE — Care Management (Signed)
Admitted to [facility] with the diagnosis of sepsis. Lives with son, Laverna Peace. Daughter is Hardin Negus [phone]). No home health. No skilled facility. No home oxygen. No Life Alert. States she has been using a stick to aid in ambulation. Daughter has been helping with bathing and dressing. Ms. Hammes feeds herself. Last seen Dr. Ellison Hughs last September 2015. Seen Dr. Grayland Ormond last year too. Good appetite. Multiple falls lately.  Shelbie Ammons RN MSN Care Management [phone]

## 2015-03-25 NOTE — Consult Note (Signed)
Anna Leonard  [phone] [phone]  ID: Angie Piercey Dahmer OB: Sep 21, 1935  MR#: [id number]  ZTI#:[id number]  Patient Care Team: Sofie Hartigan, MD as PCP - General (Family Medicine)  CHIEF COMPLAINT:  Chief Complaint  Patient presents with  . Urinary Frequency  . Extremity Weakness    INTERVAL HISTORY: Patient last evaluated in the cancer Center in March 2016. She completed her chemotherapy for mantle cell lymphoma in February 2016. Patient was admitted with UTI and possible sepsis. CT scan of the abdomen revealed significant progression of disease. Currently, she feels well and improved since admission. She continues to have weakness and fatigue. She has no neurologic complaints. She denies any fevers or night sweats. She denies any chest pain or shortness of breath. She denies any nausea, vomiting, or diarrhea. She does admit to constipation. Her urinary symptoms have resolved. Patient offers no further specific complaints.  REVIEW OF SYSTEMS:   Review of Systems  Constitutional: Positive for malaise/fatigue. Negative for fever and weight loss.  Respiratory: Negative.   Cardiovascular: Negative.   Gastrointestinal: Positive for constipation.  Genitourinary: Negative.   Neurological: Positive for weakness.    As per HPI. Otherwise, a complete review of systems is negatve.  PAST MEDICAL HISTORY: Past Medical History  Diagnosis Date  . Hypertension   . Allergy   . Type 2 diabetes mellitus   . HLD (hyperlipidemia)   . Anxiety   . Paralysis     left arm  . Cancer     lymphoma- chemo  . Bell palsy   . Mantle cell lymphoma     PAST SURGICAL HISTORY: Past Surgical History  Procedure Laterality Date  . Vesicovaginal fistula closure w/ tah    . Appendectomy    . Abdominal hysterectomy      FAMILY HISTORY Family History  Problem Relation Age of Onset  . Hypertension Mother   . Cancer Mother 66    Breast  . Cancer Father       Testicular  . Diabetes Sister   . Diabetes Brother        ADVANCED DIRECTIVES:    HEALTH MAINTENANCE: Social History  Substance Use Topics  . Smoking status: Never Smoker   . Smokeless tobacco: Never Used  . Alcohol Use: No     Colonoscopy:  PAP:  Bone density:  Lipid panel:  Allergies  Allergen Reactions  . Amoxicillin-Pot Clavulanate Other (See Comments)    diarrhea    Current Facility-Administered Medications  Medication Dose Route Frequency Provider Last Rate Last Dose  . 0.9 %  sodium chloride infusion   Intravenous Continuous Bettey Costa, MD 75 mL/hr at 03/25/15 0724    . acetaminophen (TYLENOL) tablet 650 mg  650 mg Oral Q6H PRN Bettey Costa, MD       Or  . acetaminophen (TYLENOL) suppository 650 mg  650 mg Rectal Q6H PRN Bettey Costa, MD      . alum & mag hydroxide-simeth (MAALOX/MYLANTA) 200-200-20 MG/5ML suspension 30 mL  30 mL Oral Q6H PRN Bettey Costa, MD      . amitriptyline (ELAVIL) tablet 25 mg  25 mg Oral BID Bettey Costa, MD   25 mg at 03/25/15 1030   And  . perphenazine (TRILAFON) tablet 2 mg  2 mg Oral BID Bettey Costa, MD   2 mg at 03/25/15 1030  . aspirin chewable tablet 81 mg  81 mg Oral Daily Sital Mody, MD   81 mg at 03/25/15 1030  . B-complex  with vitamin C tablet 1 tablet  1 tablet Oral Oretha Ellis, MD   1 tablet at 03/25/15 1032  . cefTRIAXone (ROCEPHIN) 1 g in dextrose 5 % 50 mL IVPB  1 g Intravenous Q24H Bettey Costa, MD   1 g at 03/24/15 1847  . cholecalciferol (VITAMIN D) tablet 1,000 Units  1,000 Units Oral Daily Bettey Costa, MD   1,000 Units at 03/25/15 1030  . docusate sodium (COLACE) capsule 300 mg  300 mg Oral BH-q7a Bettey Costa, MD   300 mg at 03/25/15 1030  . enoxaparin (LOVENOX) injection 40 mg  40 mg Subcutaneous Q24H Bettey Costa, MD   40 mg at 03/24/15 1856  . lisinopril (PRINIVIL,ZESTRIL) tablet 20 mg  20 mg Oral Daily Sital Mody, MD   20 mg at 03/25/15 1030   And  . hydrochlorothiazide (HYDRODIURIL) tablet 25 mg  25 mg Oral Daily  Sital Mody, MD   25 mg at 03/25/15 1030  . HYDROcodone-acetaminophen (NORCO/VICODIN) 5-325 MG per tablet 1-2 tablet  1-2 tablet Oral Q4H PRN Bettey Costa, MD      . Influenza vac split quadrivalent PF (FLUARIX) injection 0.5 mL  0.5 mL Intramuscular Tomorrow-1000 Sital Mody, MD      . insulin aspart (novoLOG) injection 0-5 Units  0-5 Units Subcutaneous QHS Bettey Costa, MD   2 Units at 03/24/15 2147  . insulin aspart (novoLOG) injection 0-9 Units  0-9 Units Subcutaneous TID WC Bettey Costa, MD   0 Units at 03/25/15 1018  . loratadine (CLARITIN) tablet 10 mg  10 mg Oral Daily Bettey Costa, MD   10 mg at 03/24/15 2040  . ondansetron (ZOFRAN) tablet 4 mg  4 mg Oral Q6H PRN Bettey Costa, MD       Or  . ondansetron (ZOFRAN) injection 4 mg  4 mg Intravenous Q6H PRN Sital Mody, MD      . pravastatin (PRAVACHOL) tablet 10 mg  10 mg Oral q1800 Bettey Costa, MD   10 mg at 03/24/15 1856  . senna-docusate (Senokot-S) tablet 1 tablet  1 tablet Oral QHS PRN Bettey Costa, MD   1 tablet at 03/25/15 1030  . sodium chloride 0.9 % injection 3 mL  3 mL Intravenous Q12H Bettey Costa, MD   3 mL at 03/25/15 0726    OBJECTIVE: Filed Vitals:   03/25/15 1057  BP: 140/65  Pulse: 121  Temp:   Resp:      Body mass index is 27.06 kg/(m^2).    ECOG FS:1 - Symptomatic but completely ambulatory  General: Well-developed, well-nourished, no acute distress. Eyes: Pink conjunctiva, anicteric sclera. Lungs: Clear to auscultation bilaterally. Heart: Regular rate and rhythm. No rubs, murmurs, or gallops. Abdomen: Soft, nontender, nondistended. No organomegaly noted, normoactive bowel sounds. Musculoskeletal: No edema, cyanosis, or clubbing. Neuro: Alert, answering all questions appropriately. Cranial nerves grossly intact. Skin: No rashes or petechiae noted. Psych: Normal affect. Lymphatics: No cervical, calvicular, axillary or inguinal LAD.   LAB RESULTS:  Lab Results  Component Value Date   NA 134* 03/25/2015   K 3.7 03/25/2015    CL 100* 03/25/2015   CO2 24 03/25/2015   GLUCOSE 194* 03/25/2015   BUN 17 03/25/2015   CREATININE 1.22* 03/25/2015   CALCIUM 9.1 03/25/2015   PROT 6.5 03/24/2015   ALBUMIN 3.1* 03/24/2015   AST 25 03/24/2015   ALT 18 03/24/2015   ALKPHOS 121 03/24/2015   BILITOT 0.5 03/24/2015   GFRNONAA 42* 03/25/2015   GFRAA 48* 03/25/2015    Lab  Results  Component Value Date   WBC 2.5* 03/25/2015   NEUTROABS 1.3* 03/24/2015   HGB 8.2* 03/25/2015   HCT 23.9* 03/25/2015   MCV 84.4 03/25/2015   PLT 179 03/25/2015     STUDIES: Ct Head Wo Contrast  03/24/2015   CLINICAL DATA:  Weakness, fever, burning with urination for 2-3 weeks.  EXAM: CT HEAD WITHOUT CONTRAST  TECHNIQUE: Contiguous axial images were obtained from the base of the skull through the vertex without intravenous contrast.  COMPARISON:  01/27/2015  FINDINGS: There is no evidence of mass effect, midline shift, or extra-axial fluid collections. There is no evidence of a space-occupying lesion or intracranial hemorrhage. There is no evidence of a cortical-based area of acute infarction. There is an old right frontoparietal encephalomalacia. There is periventricular white matter low attenuation likely secondary to microangiopathy.  The ventricles and sulci are appropriate for the patient's age. The basal cisterns are patent.  Visualized portions of the orbits are unremarkable. The visualized portions of the paranasal sinuses and mastoid air cells are unremarkable. Cerebrovascular atherosclerotic calcifications are noted.  There is evidence of prior right frontoparietal craniectomy. There is no acute osseous abnormality.  IMPRESSION: No acute intracranial pathology.  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   Electronically Signed   By: Kathreen Devoid   On: 03/24/2015 14:10   Ct Abdomen Pelvis W Contrast  03/24/2015   CLINICAL DATA:  Fever, weakness, burning with urination for 2-3 weeks. History of lymphoma.  EXAM: CT ABDOMEN AND PELVIS WITH  CONTRAST  TECHNIQUE: Multidetector CT imaging of the abdomen and pelvis was performed using the standard protocol following bolus administration of intravenous contrast.  CONTRAST:  19mL OMNIPAQUE IOHEXOL 300 MG/ML  SOLN  COMPARISON:  PET-CT 10/15/2014  FINDINGS: Lower chest: Mild chronic interstitial disease at the left lung base. No pleural effusion. Normal heart size.  Hepatobiliary: Normal liver. Gallbladder filled with cholelithiasis. Gallbladder is otherwise normal.  Pancreas: Normal pancreas.  Spleen: Normal spleen.  Adrenals/Urinary Tract: Normal adrenal glands. No obstructive uropathy. No nephrolithiasis. There are numerous hypodense masses throughout bilateral kidneys with the largest in the right kidney measuring 2.4 cm and the largest in the left kidney measuring 3.6 cm. There is circumferential bladder wall thickening.  Stomach/Bowel: No bowel wall thickening or dilatation. No bowel obstruction. No pneumatosis, pneumoperitoneum or portal venous gas. No abdominal pelvic free fluid. There is a small fat containing umbilical hernia.  Vascular/Lymphatic: Normal caliber abdominal aorta. Large retroperitoneal soft tissue mass measuring approximately 9.1 x 6.7 x 11.2 cm encasing the distal portion of the abdominal aorta and common iliac arteries as well as the IVC. There is a large presacral soft tissue mass measuring 8.3 x 5.3 cm encasing the right S3 nerve root. There is a left cardiophrenic mass measuring 1.3 x 3.4 cm most consistent with an enlarged lymph node.  Reproductive: Prior hysterectomy.  No adnexal mass.  Other: There is a 7 x 3.9 cm soft tissue mass in the subcutaneous fat of the left lower back.  Musculoskeletal: There is expansion of the left iliacus muscle, left gluteus maximus muscle and left gluteus medius muscle. There is no aggressive lytic or sclerotic osseous lesion. There is an ununited insufficiency fracture of the right inferior pubic ramus and right pubic symphysis. There is  increased soft tissue density within the right L5-S1 neural foramen and extending into the epidural space.  IMPRESSION: 1. The overall appearance of the abdomen/pelvis is most consistent with severe recurrence of lymphoma as described above. 2. There are  multiple hypodense masses throughout bilateral kidneys most concerning for lymphomatous involvement. 3. There is increased soft tissue density within the right L5-S1 neural foramen and extending into the epidural space likely reflecting lymphoma recurrence. 4. Cholelithiasis.   Electronically Signed   By: Kathreen Devoid   On: 03/24/2015 14:35   Mm Screening Breast Tomo Bilateral  02/27/2015   CLINICAL DATA:  Screening.  EXAM: DIGITAL SCREENING BILATERAL MAMMOGRAM WITH 3D TOMO WITH CAD  COMPARISON:  Previous exam(s).  ACR Breast Density Category b: There are scattered areas of fibroglandular density.  FINDINGS: There are no findings suspicious for malignancy. Images were processed with CAD.  IMPRESSION: No mammographic evidence of malignancy. A result letter of this screening mammogram will be mailed directly to the patient.  RECOMMENDATION: Screening mammogram in one year. (Code:SM-B-01Y)  BI-RADS CATEGORY  1: Negative.   Electronically Signed   By: Altamese Cabal M.D.   On: 02/27/2015 10:35    ASSESSMENT: Recurrent mantle cell lymphoma.  PLAN:    1. Mantle cell lymphoma: CT scan results as above reviewed independently highly suspicious for recurrent mantle cell. Patient last received chemotherapy with Rituxan and Treanda in February 2016. Patient expressed interest in reinitiating treatment, but but states she does not want treatment if she will lose her hair. Patient will require PET scan and MUGA scan prior to initiating treatment. She did not have a port with her first treatment, but likely will require one now. If patient remains in the hospital, these can be accomplished prior to discharge. If she is discharged, patient can follow-up in the [facility] within the week for further evaluation, diagnostic planning, and treatment planning.  Will follow.   Lloyd Huger, MD   03/25/2015 12:51 PM

## 2015-03-25 NOTE — Clinical Social Work Note (Signed)
Clinical Social Work Assessment  Patient Details  Name: Anna Leonard MRN: [id number] Date of Birth: [birth year]  Date of referral:  03/25/15               Reason for consult:  Facility Placement                Permission sought to share information with:  Facility Sport and exercise psychologist, Family Supports Permission granted to share information::  Yes, Verbal Permission Granted  Name::     Anna Leonard [place]::  SNF facilities in [place]   Relationship::     Contact Information:     Housing/Transportation Living arrangements for the past 2 months:  [place] of Information:  Patient Patient Interpreter Needed:  None Criminal Activity/Legal Involvement Pertinent to Current Situation/Hospitalization:  No - Comment as needed Significant Relationships:  Adult Children Lives with:  Adult Children Do you feel safe going back to the place where you live?  Yes Need for family participation in patient care:  Yes (Comment)  Care giving concerns:  Current concerns are looking at placement options.     Social Worker assessment / plan:  CSW spoke to pt.  She stated that she was in agreement with SNF placement if it was recommended.  Pt also confirmed that she lived with her son Anna Leonard.  Pt stated that she didn't think she would be able to use a walker because her arms are weak, but she had been using a "walking stick".  CSW will continue to follow.    Employment status:  Retired, Disabled (Comment on whether or not currently receiving Disability) Insurance information:  Medicare, Medicaid In [place] PT Recommendations:  [place] / Referral to community resources:  [place]  Patient/Family's Response to care:  Pt was in agreement with SNF placement if needed.   Patient/Family's Understanding of and Emotional Response to Diagnosis, Current Treatment, and Prognosis:  Pt was in agreement with SNF placement and verbalized her  understanding.    Emotional Assessment Appearance:  Appears stated age Attitude/Demeanor/Rapport:   (pt seemed tired when speaking to CSW) Affect (typically observed):  Appropriate Orientation:  Oriented to Self, Oriented to Place, Oriented to  Time, Oriented to Situation Alcohol / Substance use:  Never Used Psych involvement (Current and /or in the community):  No (Comment)  Discharge Needs  Concerns to be addressed:  Care Coordination Readmission within the last 30 days:    Current discharge risk:  Physical Impairment Barriers to Discharge:  Continued Medical Work up   [employer], LCSW 03/25/2015, 3:04 PM

## 2015-03-26 DIAGNOSIS — C8310 Mantle cell lymphoma, unspecified site: Secondary | ICD-10-CM

## 2015-03-26 DIAGNOSIS — N189 Chronic kidney disease, unspecified: Secondary | ICD-10-CM

## 2015-03-26 DIAGNOSIS — D72819 Decreased white blood cell count, unspecified: Secondary | ICD-10-CM

## 2015-03-26 DIAGNOSIS — N3000 Acute cystitis without hematuria: Secondary | ICD-10-CM

## 2015-03-26 DIAGNOSIS — N289 Disorder of kidney and ureter, unspecified: Secondary | ICD-10-CM

## 2015-03-26 DIAGNOSIS — D709 Neutropenia, unspecified: Secondary | ICD-10-CM

## 2015-03-26 DIAGNOSIS — R531 Weakness: Secondary | ICD-10-CM

## 2015-03-26 DIAGNOSIS — D649 Anemia, unspecified: Secondary | ICD-10-CM

## 2015-03-26 LAB — BASIC METABOLIC PANEL
ANION GAP: 10 (ref 5–15)
BUN: 19 mg/dL (ref 6–20)
CALCIUM: 9.2 mg/dL (ref 8.9–10.3)
CO2: 23 mmol/L (ref 22–32)
Chloride: 103 mmol/L (ref 101–111)
Creatinine, Ser: 1.4 mg/dL — ABNORMAL HIGH (ref 0.44–1.00)
GFR calc Af Amer: 41 mL/min — ABNORMAL LOW (ref >60)
GFR calc non Af Amer: 35 mL/min — ABNORMAL LOW (ref >60)
GLUCOSE: 167 mg/dL — AB (ref 65–99)
POTASSIUM: 3.2 mmol/L — AB (ref 3.5–5.1)
Sodium: 136 mmol/L (ref 135–145)

## 2015-03-26 LAB — DIFFERENTIAL
BAND NEUTROPHILS: 1 % (ref 0–10)
BASOS PCT: 0 % (ref 0–1)
Basophils Absolute: 0 10*3/uL (ref 0.0–0.1)
Blasts: 0 %
Eosinophils Absolute: 0.1 10*3/uL (ref 0.0–0.7)
Eosinophils Relative: 3 % (ref 0–5)
LYMPHS ABS: 0.4 10*3/uL — AB (ref 0.7–4.0)
Lymphocytes Relative: 18 % (ref 12–46)
METAMYELOCYTES PCT: 4 %
MONO ABS: 0.6 10*3/uL (ref 0.1–1.0)
MYELOCYTES: 2 %
Monocytes Relative: 26 % — ABNORMAL HIGH (ref 3–12)
NRBC: 0 /100{WBCs}
Neutro Abs: 1.2 10*3/uL — ABNORMAL LOW (ref 1.7–7.7)
Neutrophils Relative %: 46 % (ref 43–77)
OTHER: 0 %
PROMYELOCYTES ABS: 0 %

## 2015-03-26 LAB — IRON AND TIBC
Iron: 39 ug/dL (ref 28–170)
Saturation Ratios: 13 % (ref 10.4–31.8)
TIBC: 310 ug/dL (ref 250–450)
UIBC: 271 ug/dL

## 2015-03-26 LAB — CBC
HEMATOCRIT: 22.6 % — AB (ref 35.0–47.0)
HEMOGLOBIN: 7.6 g/dL — AB (ref 12.0–16.0)
MCH: 28.2 pg (ref 26.0–34.0)
MCHC: 33.6 g/dL (ref 32.0–36.0)
MCV: 84 fL (ref 80.0–100.0)
Platelets: 161 10*3/uL (ref 150–440)
RBC: 2.69 MIL/uL — ABNORMAL LOW (ref 3.80–5.20)
RDW: 15.3 % — AB (ref 11.5–14.5)
WBC: 2.3 10*3/uL — ABNORMAL LOW (ref 3.6–11.0)

## 2015-03-26 LAB — GLUCOSE, CAPILLARY
Glucose-Capillary: 159 mg/dL — ABNORMAL HIGH (ref 65–99)
Glucose-Capillary: 196 mg/dL — ABNORMAL HIGH (ref 65–99)
Glucose-Capillary: 160 mg/dL — ABNORMAL HIGH (ref 65–99)

## 2015-03-26 LAB — URINE CULTURE: Culture: 50000

## 2015-03-26 LAB — FERRITIN: Ferritin: 195 ng/mL (ref 11–307)

## 2015-03-26 MED ORDER — HYDROCODONE-ACETAMINOPHEN 5-325 MG PO TABS: 1.0000 | ORAL_TABLET | ORAL | Status: DC | PRN

## 2015-03-26 MED ORDER — LEVOFLOXACIN 250 MG PO TABS
250.0000 mg | ORAL_TABLET | Freq: Every day | ORAL | Status: DC
Start: 2015-03-26 — End: 2015-04-17

## 2015-03-26 MED ORDER — GLIPIZIDE ER 2.5 MG PO TB24
2.5000 mg | ORAL_TABLET | Freq: Every day | ORAL | Status: DC
Start: 2015-03-26 — End: 2015-04-20

## 2015-03-26 NOTE — Progress Notes (Signed)
Initial Nutrition Assessment   INTERVENTION:   Meals and Snacks: Cater to patient preferences Medical Food Supplement Therapy: will recommend on follow if intake poor   NUTRITION DIAGNOSIS:   No nutrition diagnosis at this time.  GOAL:   Patient will meet greater than or equal to 90% of their needs  MONITOR:    (Energy Intake, Electrolyte and Renal Profile, Anthropometrics)  REASON FOR ASSESSMENT:   Diagnosis    ASSESSMENT:   Pt admitted with kidney infection, sepsis secondary to UTI and h/o mantle cell lymphoma.  Past Medical History  Diagnosis Date  . Hypertension   . Allergy   . Type 2 diabetes mellitus   . HLD (hyperlipidemia)   . Anxiety   . Paralysis     left arm  . Cancer     lymphoma- chemo  . Bell palsy   . Mantle cell lymphoma      Diet Order:  Diet heart healthy/carb modified Room service appropriate?: Yes; Fluid consistency:: Thin Diet - low sodium heart healthy    Current Nutrition: Pt reports eating well this am. Recorded po intake 25% of breakfast and 100% of meals yesterday.  Food/Nutrition-Related History: Pt reports good appetite PTA usually eating 2-3 meals per day without supplements.   Medications: B complex, vitamin C, vitamin D, Novolog, NS at 36mL/hr  Electrolyte/Renal Profile and Glucose Profile:   Recent Labs Lab 03/24/15 1250 03/25/15 0435 03/26/15 0535  NA 132* 134* 136  K 3.7 3.7 3.2*  CL 97* 100* 103  CO2 26 24 23   BUN 16 17 19   CREATININE 1.12* 1.22* 1.40*  CALCIUM 9.0 9.1 9.2  GLUCOSE 296* 194* 167*   Protein Profile:  Recent Labs Lab 03/24/15 1250  ALBUMIN 3.1*    Gastrointestinal Profile: Last BM:  03/21/2015   Weight Change: Pt reports stable weight PTA usually 141lbs.   Height:   Ht Readings from Last 1 Encounters:  03/24/15 5\' 2"  (1.575 m)    Weight:   Wt Readings from Last 1 Encounters:  03/24/15 148 lb (67.132 kg)    BMI:  Body mass index is 27.06 kg/(m^2).   EDUCATION NEEDS:    No education needs identified at this time   [place], [place], LDN Pager [phone]

## 2015-03-26 NOTE — Clinical Social Work Placement (Signed)
   CLINICAL SOCIAL WORK PLACEMENT  NOTE  Date:  03/26/2015  Patient Details  Name: [name] MRN: [id number] Date of Birth: 28-Apr-1936  Clinical Social Work is seeking post-discharge placement for this patient at the [place] level of care (*CSW will initial, date and re-position this form in  chart as items are completed):  Yes   Patient/family provided with [place] Work Department's list of facilities offering this level of care within the geographic area requested by the patient (or if unable, by the patient's family).  Yes   Patient/family informed of their freedom to choose among providers that offer the needed level of care, that participate in Medicare, Medicaid or managed care program needed by the patient, have an available bed and are willing to accept the patient.  Yes   Patient/family informed of [facility]'s ownership interest in [facility] and [facility], as well as of the fact that they are under no obligation to receive care at these facilities.  PASRR submitted to EDS on 03/25/15     PASRR number received on 03/25/15     Existing PASRR number confirmed on       FL2 transmitted to all facilities in geographic area requested by pt/family on 03/25/15     FL2 transmitted to all facilities within larger geographic area on       Patient informed that his/her managed care company has contracts with or will negotiate with certain facilities, including the following:        Yes   Patient/family informed of bed offers received.  Patient chooses bed at [facility]     Physician recommends and patient chooses bed at      Patient to be transferred to   on  .  Patient to be transferred to facility by       Patient family notified on   of transfer.  Name of family member notified:        PHYSICIAN Please sign FL2     Additional Comment:     _______________________________________________ Naida Sleight, LCSW 03/26/2015, 1:42 PM

## 2015-03-26 NOTE — Progress Notes (Signed)
Daughter made aware that report has been called to [facility] and patient is ready to be discharged. Daughter stated that she would try to be here before 7pm. Madlyn Frankel, RN

## 2015-03-26 NOTE — Discharge Summary (Signed)
[facility] NAME: Anna Leonard    MR#:  [id number]  DATE OF BIRTH:  1936-01-12  DATE OF ADMISSION:  03/24/2015 ADMITTING PHYSICIAN: Bettey Costa, MD  DATE OF DISCHARGE: No discharge date for patient encounter.  PRIMARY CARE PHYSICIAN: FELDPAUSCH, DALE E, MD     ADMISSION DIAGNOSIS:  Cancer [C80.1] Sepsis, due to unspecified organism [A41.9]  DISCHARGE DIAGNOSIS:  Active Problems:   Sepsis   Acute cystitis without hematuria   Leukopenia   Neutropenia   Anemia   Mantle cell lymphoma   Chronic renal insufficiency   Generalized weakness   SECONDARY DIAGNOSIS:   Past Medical History  Diagnosis Date  . Hypertension   . Allergy   . Type 2 diabetes mellitus   . HLD (hyperlipidemia)   . Anxiety   . Paralysis     left arm  . Cancer     lymphoma- chemo  . Bell palsy   . Mantle cell lymphoma     .pro HOSPITAL COURSE:   The patient is 79 year old Caucasian female with history of mantle cell lymphoma who presents to the hospital with urinary frequency as well as weakness and was noted to have UTI. She was initiated on levofloxacin. She feels good today. Denies any pain. Blood cultures are negative so far. Urine culture is growing Escherichia coli, sensitive to levofloxacin CT scan in the emergency room revealed hypodense masses throughout bilateral kidneys, and other masses in the abdomen, concerning with lymphoma recurrence, oncology consultation is requested. Patient's labs revealed renal insufficiency and leukopenia as well as anemia. Patient denies any fevers or chills. Patient was seen by Dr. Grayland Ormond who recommended outpatient follow-up. Patient was seen by physical therapist who recommended skilled nursing facility placement for rehabilitation, but is available today.  His cushion by problem 1 sepsis due to acute cystitis, start levofloxacin, discontinue Rocephin, which patient was on in the hospital since  Escherichia coli is intermediately sensitive to cefazolin. Blood cultures 2 are negative so far. Urine cultures are growing Escherichia coli, that is sensitive to levofloxacin 2. Neutropenia. Appreciate oncologist input. Getting differential and place patient under neutropenic precautions if absolute neutrophil count is below 1000 3. Acute cystitis without hematuria due to Escherichia coli. Continue levofloxacin for 7 days total 4. Renal insufficiency. Likely chronic. Patient's kidney function deteriorated in August 2016, very likely related to recurrence of lymphoma, stable. No hydronephrosis on CT scan.  5. Recurrent mantle cell lymphoma, appreciate oncologist input. Patient will be seeing Dr. Grayland Ormond as outpatient 6. Hyponatremia , resolved with IV fluid administration, likely dehydration related 7. Generalized weakness. Patient was seen by physical therapist and recommended skilled nursing facility rehabilitation placement, where patient will be discharged today 8. Diabetes mellitus with hemoglobin A1c 8.2, discontinue metformin due to renal insufficiency and initiate patient on low dose of glipizide, continue sliding scale insulin DISCHARGE CONDITIONS:   Stable  CONSULTS OBTAINED:  Treatment Team:  Lloyd Huger, MD  DRUG ALLERGIES:   Allergies  Allergen Reactions  . Amoxicillin-Pot Clavulanate Other (See Comments)    diarrhea    DISCHARGE MEDICATIONS:   Current Discharge Medication List    START taking these medications   Details  HYDROcodone-acetaminophen (NORCO/VICODIN) 5-325 MG per tablet Take 1 tablet by mouth every 4 (four) hours as needed for moderate pain. Qty: 30 tablet, Refills: 0    levofloxacin (LEVAQUIN) 250 MG tablet Take 1 tablet (250 mg total) by mouth daily. Qty: 7 tablet, Refills: 0  CONTINUE these medications which have NOT CHANGED   Details  aspirin 81 MG chewable tablet Chew 81 mg by mouth daily.    B Complex-C (B-COMPLEX WITH VITAMIN C)  tablet Take 1 tablet by mouth every morning.    Cholecalciferol 1000 UNITS capsule Take 1,000 Units by mouth daily.    docusate sodium (COLACE) 100 MG capsule Take 300 mg by mouth every morning.    lisinopril-hydrochlorothiazide (PRINZIDE,ZESTORETIC) 20-25 MG per tablet TAKE ONE TABLET BY MOUTH ONCE DAILY    loratadine (CLARITIN) 10 MG tablet Take 10 mg by mouth daily.    lovastatin (MEVACOR) 40 MG tablet Take 80 mg by mouth at bedtime.     perphenazine-amitriptyline (ETRAFON/TRIAVIL) 2-25 MG TABS Take 1 tablet by mouth 2 (two) times daily.     predniSONE (STERAPRED UNI-PAK 21 TAB) 10 MG (21) TBPK tablet Take 6 tabs first day, 5 tab next, then 4 on day 3rd, 3 tabs on day 4th , 2 tab on day 5th, and 1 tab on 6th day. Qty: 21 tablet, Refills: 0      STOP taking these medications     metFORMIN (GLUCOPHAGE) 500 MG tablet      sulfamethoxazole-trimethoprim (BACTRIM DS,SEPTRA DS) 800-160 MG per tablet          DISCHARGE INSTRUCTIONS:    Patient is to follow-up with primary care physician and oncologist as outpatient  If you experience worsening of your admission symptoms, develop shortness of breath, life threatening emergency, suicidal or homicidal thoughts you must seek medical attention immediately by calling 911 or calling your MD immediately  if symptoms less severe.  You Must read complete instructions/literature along with all the possible adverse reactions/side effects for all the Medicines you take and that have been prescribed to you. Take any new Medicines after you have completely understood and accept all the possible adverse reactions/side effects.   Please note  You were cared for by a hospitalist during your hospital stay. If you have any questions about your discharge medications or the care you received while you were in the hospital after you are discharged, you can call the unit and asked to speak with the hospitalist on call if the hospitalist that took care of  you is not available. Once you are discharged, your primary care physician will handle any further medical issues. Please note that NO REFILLS for any discharge medications will be authorized once you are discharged, as it is imperative that you return to your primary care physician (or establish a relationship with a primary care physician if you do not have one) for your aftercare needs so that they can reassess your need for medications and monitor your lab values.    Today   CHIEF COMPLAINT:   Chief Complaint  Patient presents with  . Urinary Frequency  . Extremity Weakness    HISTORY OF PRESENT ILLNESS:  Anna Leonard  is a 79 y.o. female with a known history of mantle cell lymphoma who presents to the hospital with urinary frequency as well as weakness and was noted to have UTI. She was initiated on levofloxacin. She feels good today. Denies any pain. Blood cultures are negative so far. Urine culture is growing Escherichia coli, sensitive to levofloxacin CT scan in the emergency room revealed hypodense masses throughout bilateral kidneys, and other masses in the abdomen, concerning with lymphoma recurrence, oncology consultation is requested. Patient's labs revealed renal insufficiency and leukopenia as well as anemia. Patient denies any fevers or chills. Patient was  seen by Dr. Grayland Ormond who recommended outpatient follow-up. Patient was seen by physical therapist who recommended skilled nursing facility placement for rehabilitation, but is available today.  His cushion by problem 1 sepsis due to acute cystitis, start levofloxacin, discontinue Rocephin, which patient was on in the hospital since Escherichia coli is intermediately sensitive to cefazolin. Blood cultures 2 are negative so far. Urine cultures are growing Escherichia coli, that is sensitive to levofloxacin 2. Neutropenia. Appreciate oncologist input. Getting differential and place patient under neutropenic precautions if absolute  neutrophil count is below 1000 3. Acute cystitis without hematuria due to Escherichia coli. Continue levofloxacin for 7 days total 4. Renal insufficiency. Likely chronic. Patient's kidney function deteriorated in August 2016, very likely related to recurrence of lymphoma, stable. No hydronephrosis on CT scan.  5. Recurrent mantle cell lymphoma, appreciate oncologist input. Patient will be seeing Dr. Grayland Ormond as outpatient 6. Hyponatremia , resolved with IV fluid administration, likely dehydration related 7. Generalized weakness. Patient was seen by physical therapist and recommended skilled nursing facility rehabilitation placement, where patient will be discharged today 8. Diabetes mellitus with hemoglobin A1c 8.2, discontinue metformin due to renal insufficiency and initiate patient on low dose of glipizide, continue sliding scale insulin    VITAL SIGNS:  Blood pressure 131/58, pulse 122, temperature 98.1 F (36.7 C), temperature source Oral, resp. rate 20, height 5\' 2"  (1.575 m), weight 67.132 kg (148 lb), SpO2 96 %.  I/O:   Intake/Output Summary (Last 24 hours) at 03/26/15 1357 Last data filed at 03/26/15 1317  Gross per 24 hour  Intake    480 ml  Output   1100 ml  Net   -620 ml    PHYSICAL EXAMINATION:  GENERAL:  79 y.o.-year-old patient lying in the bed with no acute distress.  EYES: Pupils equal, round, reactive to light and accommodation. No scleral icterus. Extraocular muscles intact.  HEENT: Head atraumatic, normocephalic. Oropharynx and nasopharynx clear.  NECK:  Supple, no jugular venous distention. No thyroid enlargement, no tenderness.  LUNGS: Normal breath sounds bilaterally, no wheezing, rales,rhonchi or crepitation. No use of accessory muscles of respiration.  CARDIOVASCULAR: S1, S2 normal. No murmurs, rubs, or gallops.  ABDOMEN: Soft, non-tender, non-distended. Bowel sounds present. No organomegaly or mass.  EXTREMITIES: No pedal edema, cyanosis, or clubbing.   NEUROLOGIC: Cranial nerves II through XII are intact. Muscle strength 5/5 in all extremities. Sensation intact. Gait not checked.  PSYCHIATRIC: The patient is alert and oriented x 3.  SKIN: No obvious rash, lesion, or ulcer.   DATA REVIEW:   CBC  Recent Labs Lab 03/26/15 0535  WBC 2.3*  HGB 7.6*  HCT 22.6*  PLT 161    Chemistries   Recent Labs Lab 03/24/15 1250  03/26/15 0535  NA 132*  < > 136  K 3.7  < > 3.2*  CL 97*  < > 103  CO2 26  < > 23  GLUCOSE 296*  < > 167*  BUN 16  < > 19  CREATININE 1.12*  < > 1.40*  CALCIUM 9.0  < > 9.2  AST 25  --   --   ALT 18  --   --   ALKPHOS 121  --   --   BILITOT 0.5  --   --   < > = values in this interval not displayed.  Cardiac Enzymes  Recent Labs Lab 03/24/15 1250  TROPONINI <0.03    Microbiology Results  Results for orders placed or performed during the hospital encounter  of 03/24/15  Urine culture     Status: None   Collection Time: 03/24/15  1:24 PM  Result Value Ref Range Status   Specimen Description URINE, RANDOM  Final   Special Requests Immunocompromised  Final   Culture 50,000 COLONIES/mL ESCHERICHIA COLI  Final   Report Status 03/26/2015 FINAL  Final   Organism ID, Bacteria ESCHERICHIA COLI  Final      Susceptibility   Escherichia coli - MIC*    AMPICILLIN >=32 RESISTANT Resistant     CEFTAZIDIME <=1 SENSITIVE Sensitive     CEFAZOLIN 16 INTERMEDIATE Intermediate     CEFTRIAXONE <=1 SENSITIVE Sensitive     CIPROFLOXACIN <=0.25 SENSITIVE Sensitive     GENTAMICIN <=1 SENSITIVE Sensitive     IMIPENEM <=0.25 SENSITIVE Sensitive     TRIMETH/SULFA <=20 SENSITIVE Sensitive     NITROFURANTOIN Value in next row Sensitive      SENSITIVE<=16    PIP/TAZO Value in next row Sensitive      SENSITIVE<=4    * 50,000 COLONIES/mL ESCHERICHIA COLI  Culture, blood (routine x 2)     Status: None (Preliminary result)   Collection Time: 03/24/15  3:23 PM  Result Value Ref Range Status   Specimen Description BLOOD  RIGHT ARM  Final   Special Requests BOTTLES DRAWN AEROBIC AND ANAEROBIC [place]  Final   Culture NO GROWTH 2 DAYS  Final   Report Status PENDING  Incomplete  Culture, blood (routine x 2)     Status: None (Preliminary result)   Collection Time: 03/24/15  4:02 PM  Result Value Ref Range Status   Specimen Description BLOOD RIGHT HAND  Final   Special Requests BOTTLES DRAWN AEROBIC AND ANAEROBIC 6CC  Final   Culture NO GROWTH 2 DAYS  Final   Report Status PENDING  Incomplete    RADIOLOGY:  Ct Abdomen Pelvis W Contrast  03/24/2015   CLINICAL DATA:  Fever, weakness, burning with urination for 2-3 weeks. History of lymphoma.  EXAM: CT ABDOMEN AND PELVIS WITH CONTRAST  TECHNIQUE: Multidetector CT imaging of the abdomen and pelvis was performed using the standard protocol following bolus administration of intravenous contrast.  CONTRAST:  114mL OMNIPAQUE IOHEXOL 300 MG/ML  SOLN  COMPARISON:  PET-CT 10/15/2014  FINDINGS: Lower chest: Mild chronic interstitial disease at the left lung base. No pleural effusion. Normal heart size.  Hepatobiliary: Normal liver. Gallbladder filled with cholelithiasis. Gallbladder is otherwise normal.  Pancreas: Normal pancreas.  Spleen: Normal spleen.  Adrenals/Urinary Tract: Normal adrenal glands. No obstructive uropathy. No nephrolithiasis. There are numerous hypodense masses throughout bilateral kidneys with the largest in the right kidney measuring 2.4 cm and the largest in the left kidney measuring 3.6 cm. There is circumferential bladder wall thickening.  Stomach/Bowel: No bowel wall thickening or dilatation. No bowel obstruction. No pneumatosis, pneumoperitoneum or portal venous gas. No abdominal pelvic free fluid. There is a small fat containing umbilical hernia.  Vascular/Lymphatic: Normal caliber abdominal aorta. Large retroperitoneal soft tissue mass measuring approximately 9.1 x 6.7 x 11.2 cm encasing the distal portion of the abdominal aorta and common iliac arteries as  well as the IVC. There is a large presacral soft tissue mass measuring 8.3 x 5.3 cm encasing the right S3 nerve root. There is a left cardiophrenic mass measuring 1.3 x 3.4 cm most consistent with an enlarged lymph node.  Reproductive: Prior hysterectomy.  No adnexal mass.  Other: There is a 7 x 3.9 cm soft tissue mass in the subcutaneous fat of the left  lower back.  Musculoskeletal: There is expansion of the left iliacus muscle, left gluteus maximus muscle and left gluteus medius muscle. There is no aggressive lytic or sclerotic osseous lesion. There is an ununited insufficiency fracture of the right inferior pubic ramus and right pubic symphysis. There is increased soft tissue density within the right L5-S1 neural foramen and extending into the epidural space.  IMPRESSION: 1. The overall appearance of the abdomen/pelvis is most consistent with severe recurrence of lymphoma as described above. 2. There are multiple hypodense masses throughout bilateral kidneys most concerning for lymphomatous involvement. 3. There is increased soft tissue density within the right L5-S1 neural foramen and extending into the epidural space likely reflecting lymphoma recurrence. 4. Cholelithiasis.   Electronically Signed   By: Kathreen Devoid   On: 03/24/2015 14:35    EKG:   Orders placed or performed during the hospital encounter of 03/24/15  . EKG 12-Lead  . EKG 12-Lead      Management plans discussed with the patient, family and they are in agreement.  CODE STATUS:     Code Status Orders        Start     Ordered   03/24/15 1712  Full code   Continuous     03/24/15 1712      TOTAL TIME TAKING CARE OF THIS PATIENT: 45 minutes.    Theodoro Grist M.D on 03/26/2015 at 1:57 PM  Between 7am to 6pm - Pager - [phone]  After 6pm go to [email or web address] - password [email or web address] [facility]  [place] [facility]  Office  [phone]  CC: Primary care physician; [facility], Chrissie Noa, MD

## 2015-03-26 NOTE — Progress Notes (Addendum)
[place] [facility] [place] NAME: Anna Leonard    MR#:  [id number]  DATE OF BIRTH:  June 05, 1936  SUBJECTIVE:  CHIEF COMPLAINT:   Chief Complaint  Patient presents with  . Urinary Frequency  . Extremity Weakness   patient is 79 year old Caucasian female who presents to the hospital with urinary frequency as well as weakness and was noted to have UTI. She was initiated on levofloxacin. She feels good today. Denies any pain. Blood cultures are negative so far. Urine culture is growing Escherichia coli, sensitive to levofloxacin CT scan in the emergency room revealed hypodense masses throughout bilateral kidneys, and other masses in the abdomen, concerning with lymphoma recurrence, oncology consultation is requested. Patient's labs revealed renal insufficiency and leukopenia as well as anemia. Patient denies any fevers or chills. Patient was seen by Dr. Grayland Ormond who recommended outpatient follow-up. Patient was seen by physical therapist who recommended skilled nursing facility placement for rehabilitation, social worker is involved for bed search Review of Systems  Constitutional: Negative for fever, chills and weight loss.  HENT: Negative for congestion.   Eyes: Negative for blurred vision and double vision.  Respiratory: Negative for cough, sputum production, shortness of breath and wheezing.   Cardiovascular: Negative for chest pain, palpitations, orthopnea, leg swelling and PND.  Gastrointestinal: Negative for nausea, vomiting, abdominal pain, diarrhea, constipation and blood in stool.  Genitourinary: Negative for dysuria, urgency, frequency and hematuria.  Musculoskeletal: Negative for falls.  Neurological: Negative for dizziness, tremors, focal weakness and headaches.  Endo/Heme/Allergies: Does not bruise/bleed easily.  Psychiatric/Behavioral: Negative for depression. The patient does not have insomnia.     VITAL SIGNS: Blood pressure 131/58,  pulse 122, temperature 98.1 F (36.7 C), temperature source Oral, resp. rate 20, height 5\' 2"  (1.575 m), weight 67.132 kg (148 lb), SpO2 96 %.  PHYSICAL EXAMINATION:   GENERAL:  79 y.o.-year-old patient lying in the bed with no acute distress.  EYES: Pupils equal, round, reactive to light and accommodation. No scleral icterus. Extraocular muscles intact.  HEENT: Head atraumatic, normocephalic. Oropharynx and nasopharynx clear.  NECK:  Supple, no jugular venous distention. No thyroid enlargement, no tenderness.  LUNGS: Normal breath sounds bilaterally, no wheezing, rales,rhonchi or crepitation. No use of accessory muscles of respiration.  CARDIOVASCULAR: S1, S2 normal. No murmurs, rubs, or gallops.  ABDOMEN: Soft, nontender, nondistended. Bowel sounds present. No organomegaly or mass.  EXTREMITIES: No pedal edema, cyanosis, or clubbing.  NEUROLOGIC: Cranial nerves II through XII are intact. Muscle strength 5/5 in all extremities. Sensation intact. Gait not checked.  PSYCHIATRIC: The patient is alert and oriented x 3.  SKIN: No obvious rash, lesion, or ulcer.   ORDERS/RESULTS REVIEWED:   CBC  Recent Labs Lab 03/24/15 1250 03/25/15 0435 03/26/15 0535  WBC 2.3* 2.5* 2.3*  HGB 8.4* 8.2* 7.6*  HCT 24.2* 23.9* 22.6*  PLT 191 179 161  MCV 83.6 84.4 84.0  MCH 29.0 29.0 28.2  MCHC 34.7 34.4 33.6  RDW 14.8* 15.2* 15.3*  LYMPHSABS 0.5*  --   --   MONOABS 0.4  --   --   EOSABS 0.1  --   --   BASOSABS 0.0  --   --    ------------------------------------------------------------------------------------------------------------------  Chemistries   Recent Labs Lab 03/24/15 1250 03/25/15 0435 03/26/15 0535  NA 132* 134* 136  K 3.7 3.7 3.2*  CL 97* 100* 103  CO2 26 24 23   GLUCOSE 296* 194* 167*  BUN 16 17 19   CREATININE  1.12* 1.22* 1.40*  CALCIUM 9.0 9.1 9.2  AST 25  --   --   ALT 18  --   --   ALKPHOS 121  --   --   BILITOT 0.5  --   --     ------------------------------------------------------------------------------------------------------------------ estimated creatinine clearance is 30.2 mL/min (by C-G formula based on Cr of 1.4). ------------------------------------------------------------------------------------------------------------------ No results for input(s): TSH, T4TOTAL, T3FREE, THYROIDAB in the last 72 hours.  Invalid input(s): FREET3  Cardiac Enzymes  Recent Labs Lab 03/24/15 1250  TROPONINI <0.03   ------------------------------------------------------------------------------------------------------------------ Invalid input(s): POCBNP ---------------------------------------------------------------------------------------------------------------  RADIOLOGY: Ct Head Wo Contrast  03/24/2015   CLINICAL DATA:  Weakness, fever, burning with urination for 2-3 weeks.  EXAM: CT HEAD WITHOUT CONTRAST  TECHNIQUE: Contiguous axial images were obtained from the base of the skull through the vertex without intravenous contrast.  COMPARISON:  01/27/2015  FINDINGS: There is no evidence of mass effect, midline shift, or extra-axial fluid collections. There is no evidence of a space-occupying lesion or intracranial hemorrhage. There is no evidence of a cortical-based area of acute infarction. There is an old right frontoparietal encephalomalacia. There is periventricular white matter low attenuation likely secondary to microangiopathy.  The ventricles and sulci are appropriate for the patient's age. The basal cisterns are patent.  Visualized portions of the orbits are unremarkable. The visualized portions of the paranasal sinuses and mastoid air cells are unremarkable. Cerebrovascular atherosclerotic calcifications are noted.  There is evidence of prior right frontoparietal craniectomy. There is no acute osseous abnormality.  IMPRESSION: No acute intracranial pathology.  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   Electronically  Signed   By: Kathreen Devoid   On: 03/24/2015 14:10   Ct Abdomen Pelvis W Contrast  03/24/2015   CLINICAL DATA:  Fever, weakness, burning with urination for 2-3 weeks. History of lymphoma.  EXAM: CT ABDOMEN AND PELVIS WITH CONTRAST  TECHNIQUE: Multidetector CT imaging of the abdomen and pelvis was performed using the standard protocol following bolus administration of intravenous contrast.  CONTRAST:  132mL OMNIPAQUE IOHEXOL 300 MG/ML  SOLN  COMPARISON:  PET-CT 10/15/2014  FINDINGS: Lower chest: Mild chronic interstitial disease at the left lung base. No pleural effusion. Normal heart size.  Hepatobiliary: Normal liver. Gallbladder filled with cholelithiasis. Gallbladder is otherwise normal.  Pancreas: Normal pancreas.  Spleen: Normal spleen.  Adrenals/Urinary Tract: Normal adrenal glands. No obstructive uropathy. No nephrolithiasis. There are numerous hypodense masses throughout bilateral kidneys with the largest in the right kidney measuring 2.4 cm and the largest in the left kidney measuring 3.6 cm. There is circumferential bladder wall thickening.  Stomach/Bowel: No bowel wall thickening or dilatation. No bowel obstruction. No pneumatosis, pneumoperitoneum or portal venous gas. No abdominal pelvic free fluid. There is a small fat containing umbilical hernia.  Vascular/Lymphatic: Normal caliber abdominal aorta. Large retroperitoneal soft tissue mass measuring approximately 9.1 x 6.7 x 11.2 cm encasing the distal portion of the abdominal aorta and common iliac arteries as well as the IVC. There is a large presacral soft tissue mass measuring 8.3 x 5.3 cm encasing the right S3 nerve root. There is a left cardiophrenic mass measuring 1.3 x 3.4 cm most consistent with an enlarged lymph node.  Reproductive: Prior hysterectomy.  No adnexal mass.  Other: There is a 7 x 3.9 cm soft tissue mass in the subcutaneous fat of the left lower back.  Musculoskeletal: There is expansion of the left iliacus muscle, left gluteus  maximus muscle and left gluteus medius muscle. There is no aggressive lytic  or sclerotic osseous lesion. There is an ununited insufficiency fracture of the right inferior pubic ramus and right pubic symphysis. There is increased soft tissue density within the right L5-S1 neural foramen and extending into the epidural space.  IMPRESSION: 1. The overall appearance of the abdomen/pelvis is most consistent with severe recurrence of lymphoma as described above. 2. There are multiple hypodense masses throughout bilateral kidneys most concerning for lymphomatous involvement. 3. There is increased soft tissue density within the right L5-S1 neural foramen and extending into the epidural space likely reflecting lymphoma recurrence. 4. Cholelithiasis.   Electronically Signed   By: Kathreen Devoid   On: 03/24/2015 14:35    EKG:  Orders placed or performed during the hospital encounter of 03/24/15  . EKG 12-Lead  . EKG 12-Lead    ASSESSMENT AND PLAN:  Active Problems:   Sepsis  1 sepsis due to acute cystitis, continue levofloxacin. Blood cultures 2 are negative so far. Urine cultures are growing Escherichia coli, that is sensitive to levofloxacin 2. Neutropenia. Appreciate oncologist input. The differential and place patient under neutropenic precautions if needed 3. Acute cystitis without hematuria due to Escherichia coli. Continue levofloxacin 4. Renal insufficiency. Likely chronic. Patient's kidney function deteriorated in August 2016, very likely related to recurrence of lymphoma, stable. No hydronephrosis on CT scan.  5. Recurrent mantle cell lymphoma, appreciate oncologist input. Patient will be seeing Dr. Grayland Ormond as outpatient 6. Hyponatremia , resolved with IV fluid administration, likely dehydration related 7. Generalized weakness. Patient was seen by physical therapist and recommended skilled nursing facility rehabilitation placement for which social work is involved 8. Diabetes mellitus with  hemoglobin A1c 8.2, discontinue metformin due to renal insufficiency and initiate patient on low dose of glipizide, continue sliding scale insulin    Management plans discussed with the patient, family and they are in agreement.   DRUG ALLERGIES:  Allergies  Allergen Reactions  . Amoxicillin-Pot Clavulanate Other (See Comments)    diarrhea    CODE STATUS:     Code Status Orders        Start     Ordered   03/24/15 1712  Full code   Continuous     03/24/15 1712      TOTAL TIME TAKING CARE OF THIS PATIENT: 40 minutes.    Theodoro Grist M.D on 03/26/2015 at 11:38 AM  Between 7am to 6pm - Pager - [phone]  After 6pm go to [email or web address] - password [email or web address] [facility]  [facility]  Office  [phone]  CC: Primary care physician; [facility], Chrissie Noa, MD

## 2015-03-26 NOTE — Clinical Social Work Placement (Signed)
   CLINICAL SOCIAL WORK PLACEMENT  NOTE  Date:  03/26/2015  Patient Details  Name: Anna Leonard MRN: [id number] Date of Birth: [birth year]  Clinical Social Work is seeking post-discharge placement for this patient at the [place] level of care (*CSW will initial, date and re-position this form in  chart as items are completed):  Yes   Patient/family provided with [facility] Work Department's list of facilities offering this level of care within the geographic area requested by the patient (or if unable, by the patient's family).  Yes   Patient/family informed of their freedom to choose among providers that offer the needed level of care, that participate in Medicare, Medicaid or managed care program needed by the patient, have an available bed and are willing to accept the patient.  Yes   Patient/family informed of [facility]'s ownership interest in [facility] and [facility], as well as of the fact that they are under no obligation to receive care at these facilities.  PASRR submitted to EDS on 03/25/15     PASRR number received on 03/25/15     Existing PASRR number confirmed on       FL2 transmitted to all facilities in geographic area requested by pt/family on 03/25/15     FL2 transmitted to all facilities within larger geographic area on       Patient informed that his/her managed care company has contracts with or will negotiate with certain facilities, including the following:        Yes   Patient/family informed of bed offers received.  Patient chooses bed at [facility]     Physician recommends and patient chooses bed at      Patient to be transferred to [facility] on 03/26/15.  Patient to be transferred to facility by family     Patient family notified on 03/26/15 of transfer.  Name of family member notified:  Daughter providing transport.      PHYSICIAN Please sign FL2      Additional Comment:    _______________________________________________ Alonna Buckler, LCSW 03/26/2015, 3:38 PM

## 2015-03-26 NOTE — Progress Notes (Signed)
Report called to [employer] at [facility]. Daughter to provide transportation to facility. Awaiting transportation. Madlyn Frankel, RN

## 2015-03-26 NOTE — Plan of Care (Signed)
Problem: Discharge Progression Outcomes Goal: Other Discharge Outcomes/Goals Plan of care progress to goal: Pain: denies pain Hemodynamically: VSS Diet: tolerated diet Activity: one assist to bsc

## 2015-03-26 NOTE — Progress Notes (Signed)
Family to arrive to provide transportation after 7:30pm. Madlyn Frankel, RN

## 2015-03-26 NOTE — Clinical Social Work Note (Addendum)
CSW extended bed offers to pt's daughter Freda Munro.  Daughter explained that pt would like [facility].  CSW contacted staff at [facility] to inform them of family's selection.    CSW was informed by MD that pt is ready for d/c.  CSW informed pt and pt's daughter.  Pt is in agreement to going to [facility].  CSW asked RN to make sure pt has had a bowel movement.  Pt's daughter will transport pt to facility at discharge.  CSW initiated authorization with [facility].  Pt will be ready for d/c pending authorization and bowel movement.  CSW will continue to follow and assist with d/c planning needs.  [place], [place]

## 2015-03-26 NOTE — Progress Notes (Signed)
CSW notified that Pt has been medically cleared for dc to SNF. SNF bed confirmed at [facility]. CSW received insurance preauth #[id number]  Pt's daughter will provide transport after Pt has bm. RN to call report.   Toma Copier, [place]

## 2015-03-29 LAB — CULTURE, BLOOD (ROUTINE X 2)
CULTURE: NO GROWTH
Culture: NO GROWTH

## 2015-04-16 ENCOUNTER — Telehealth: Payer: Self-pay | Admitting: *Deleted

## 2015-04-16 NOTE — Telephone Encounter (Signed)
Pt is in [facility] post hospitalization for her kidneys. She is confused and unable to be transported by her daughter in her car due to her condtion. Has an appt 10/4 and thinks it will have to be rescheduled.

## 2015-04-16 NOTE — Telephone Encounter (Signed)
Need to see her this week per Dr Grayland Ormond. See if transportation can be arranged to get her here. Daughter is checking with [facility] to see if they can transport her on Thursday afternoon

## 2015-04-17 ENCOUNTER — Inpatient Hospital Stay: Payer: Commercial Managed Care - HMO

## 2015-04-17 ENCOUNTER — Inpatient Hospital Stay
Admission: EM | Admit: 2015-04-17 | Discharge: 2015-04-20 | DRG: 812 | Disposition: A | Discharge status: Hospice | Payer: Commercial Managed Care - HMO | Attending: Internal Medicine | Admitting: Internal Medicine

## 2015-04-17 DIAGNOSIS — E875 Hyperkalemia: Secondary | ICD-10-CM | POA: Diagnosis present

## 2015-04-17 DIAGNOSIS — D61818 Other pancytopenia: Secondary | ICD-10-CM | POA: Diagnosis present

## 2015-04-17 DIAGNOSIS — N183 Chronic kidney disease, stage 3 (moderate): Secondary | ICD-10-CM | POA: Diagnosis present

## 2015-04-17 DIAGNOSIS — D582 Other hemoglobinopathies: Secondary | ICD-10-CM

## 2015-04-17 DIAGNOSIS — D72829 Elevated white blood cell count, unspecified: Secondary | ICD-10-CM

## 2015-04-17 DIAGNOSIS — Z515 Encounter for palliative care: Secondary | ICD-10-CM | POA: Diagnosis not present

## 2015-04-17 DIAGNOSIS — I129 Hypertensive chronic kidney disease with stage 1 through stage 4 chronic kidney disease, or unspecified chronic kidney disease: Secondary | ICD-10-CM | POA: Diagnosis present

## 2015-04-17 DIAGNOSIS — K922 Gastrointestinal hemorrhage, unspecified: Secondary | ICD-10-CM | POA: Diagnosis present

## 2015-04-17 DIAGNOSIS — D649 Anemia, unspecified: Secondary | ICD-10-CM | POA: Diagnosis present

## 2015-04-17 DIAGNOSIS — N179 Acute kidney failure, unspecified: Secondary | ICD-10-CM | POA: Diagnosis present

## 2015-04-17 DIAGNOSIS — D6959 Other secondary thrombocytopenia: Secondary | ICD-10-CM | POA: Diagnosis present

## 2015-04-17 DIAGNOSIS — C8310 Mantle cell lymphoma, unspecified site: Secondary | ICD-10-CM | POA: Diagnosis present

## 2015-04-17 DIAGNOSIS — Z881 Allergy status to other antibiotic agents status: Secondary | ICD-10-CM

## 2015-04-17 DIAGNOSIS — D696 Thrombocytopenia, unspecified: Secondary | ICD-10-CM | POA: Diagnosis present

## 2015-04-17 DIAGNOSIS — Z66 Do not resuscitate: Secondary | ICD-10-CM | POA: Diagnosis not present

## 2015-04-17 DIAGNOSIS — N189 Chronic kidney disease, unspecified: Secondary | ICD-10-CM

## 2015-04-17 DIAGNOSIS — Z79899 Other long term (current) drug therapy: Secondary | ICD-10-CM | POA: Diagnosis not present

## 2015-04-17 DIAGNOSIS — Z794 Long term (current) use of insulin: Secondary | ICD-10-CM

## 2015-04-17 DIAGNOSIS — E785 Hyperlipidemia, unspecified: Secondary | ICD-10-CM | POA: Diagnosis present

## 2015-04-17 DIAGNOSIS — D500 Iron deficiency anemia secondary to blood loss (chronic): Secondary | ICD-10-CM | POA: Diagnosis present

## 2015-04-17 DIAGNOSIS — K921 Melena: Secondary | ICD-10-CM | POA: Diagnosis present

## 2015-04-17 DIAGNOSIS — D62 Acute posthemorrhagic anemia: Principal | ICD-10-CM | POA: Diagnosis present

## 2015-04-17 DIAGNOSIS — E860 Dehydration: Secondary | ICD-10-CM | POA: Diagnosis present

## 2015-04-17 DIAGNOSIS — E871 Hypo-osmolality and hyponatremia: Secondary | ICD-10-CM | POA: Diagnosis present

## 2015-04-17 DIAGNOSIS — E119 Type 2 diabetes mellitus without complications: Secondary | ICD-10-CM | POA: Diagnosis present

## 2015-04-17 DIAGNOSIS — Z7982 Long term (current) use of aspirin: Secondary | ICD-10-CM | POA: Diagnosis not present

## 2015-04-17 DIAGNOSIS — Z9221 Personal history of antineoplastic chemotherapy: Secondary | ICD-10-CM | POA: Diagnosis not present

## 2015-04-17 DIAGNOSIS — N390 Urinary tract infection, site not specified: Secondary | ICD-10-CM | POA: Diagnosis present

## 2015-04-17 DIAGNOSIS — R609 Edema, unspecified: Secondary | ICD-10-CM

## 2015-04-17 LAB — CBC WITH DIFFERENTIAL/PLATELET
BASOS ABS: 0 10*3/uL (ref 0–0.1)
BASOS PCT: 0 %
Band Neutrophils: 2 %
EOS ABS: 0 10*3/uL (ref 0–0.7)
EOS PCT: 0 %
HCT: 17.8 % — ABNORMAL LOW (ref 35.0–47.0)
HEMOGLOBIN: 5.8 g/dL — AB (ref 12.0–16.0)
LYMPHS PCT: 15 %
Lymphs Abs: 7.8 10*3/uL — ABNORMAL HIGH (ref 1.0–3.6)
MCH: 27.1 pg (ref 26.0–34.0)
MCHC: 32.7 g/dL (ref 32.0–36.0)
MCV: 83 fL (ref 80.0–100.0)
MONO ABS: 0 10*3/uL — AB (ref 0.2–0.9)
MONOS PCT: 0 %
Metamyelocytes Relative: 1 %
NEUTROS PCT: 2 %
NRBC: 1 /100{WBCs} — AB
Neutro Abs: 2.6 10*3/uL (ref 1.4–6.5)
Other: 80 %
PLATELETS: 11 10*3/uL — AB (ref 150–440)
RBC: 2.14 MIL/uL — AB (ref 3.80–5.20)
RDW: 15.9 % — ABNORMAL HIGH (ref 11.5–14.5)
WBC: 52.2 10*3/uL (ref 3.6–11.0)

## 2015-04-17 LAB — COMPREHENSIVE METABOLIC PANEL
ALBUMIN: 2.5 g/dL — AB (ref 3.5–5.0)
ALK PHOS: 225 U/L — AB (ref 38–126)
ALT: 16 U/L (ref 14–54)
AST: 25 U/L (ref 15–41)
Anion gap: 9 (ref 5–15)
BUN: 63 mg/dL — ABNORMAL HIGH (ref 6–20)
CALCIUM: 9.3 mg/dL (ref 8.9–10.3)
CHLORIDE: 96 mmol/L — AB (ref 101–111)
CO2: 25 mmol/L (ref 22–32)
CREATININE: 2.29 mg/dL — AB (ref 0.44–1.00)
GFR calc Af Amer: 23 mL/min — ABNORMAL LOW (ref >60)
GFR calc non Af Amer: 19 mL/min — ABNORMAL LOW (ref >60)
GLUCOSE: 169 mg/dL — AB (ref 65–99)
Potassium: 5.9 mmol/L — ABNORMAL HIGH (ref 3.5–5.1)
SODIUM: 130 mmol/L — AB (ref 135–145)
Total Bilirubin: 0.5 mg/dL (ref 0.3–1.2)
Total Protein: 6 g/dL — ABNORMAL LOW (ref 6.5–8.1)

## 2015-04-17 LAB — ABO/RH: ABO/RH(D): O POS

## 2015-04-17 LAB — BASIC METABOLIC PANEL
Anion gap: 11 (ref 5–15)
BUN: 58 mg/dL — AB (ref 6–20)
CALCIUM: 9.1 mg/dL (ref 8.9–10.3)
CO2: 24 mmol/L (ref 22–32)
Chloride: 99 mmol/L — ABNORMAL LOW (ref 101–111)
Creatinine, Ser: 2.23 mg/dL — ABNORMAL HIGH (ref 0.44–1.00)
GFR calc Af Amer: 23 mL/min — ABNORMAL LOW (ref >60)
GFR, EST NON AFRICAN AMERICAN: 20 mL/min — AB (ref >60)
GLUCOSE: 130 mg/dL — AB (ref 65–99)
POTASSIUM: 5 mmol/L (ref 3.5–5.1)
Sodium: 134 mmol/L — ABNORMAL LOW (ref 135–145)

## 2015-04-17 LAB — HEMOGLOBIN A1C: Hgb A1c MFr Bld: 8.6 % — ABNORMAL HIGH (ref 4.0–6.0)

## 2015-04-17 LAB — PREPARE RBC (CROSSMATCH)

## 2015-04-17 LAB — GLUCOSE, CAPILLARY
GLUCOSE-CAPILLARY: 131 mg/dL — AB (ref 65–99)
GLUCOSE-CAPILLARY: 137 mg/dL — AB (ref 65–99)

## 2015-04-17 LAB — URINALYSIS COMPLETE WITH MICROSCOPIC (ARMC ONLY)
Bilirubin Urine: NEGATIVE
GLUCOSE, UA: NEGATIVE mg/dL
Ketones, ur: NEGATIVE mg/dL
NITRITE: NEGATIVE
PROTEIN: 30 mg/dL — AB
Specific Gravity, Urine: 1.013 (ref 1.005–1.030)
pH: 7 (ref 5.0–8.0)

## 2015-04-17 LAB — TROPONIN I

## 2015-04-17 LAB — HEMOGLOBIN: HEMOGLOBIN: 6.3 g/dL — AB (ref 12.0–16.0)

## 2015-04-17 LAB — PATHOLOGIST SMEAR REVIEW

## 2015-04-17 LAB — FERRITIN: Ferritin: 1654 ng/mL — ABNORMAL HIGH (ref 11–307)

## 2015-04-17 MED ORDER — ACETAMINOPHEN 325 MG PO TABS
650.0000 mg | ORAL_TABLET | Freq: Four times a day (QID) | ORAL | Status: DC | PRN
  Filled 2015-04-17: qty 2

## 2015-04-17 MED ORDER — DOCUSATE SODIUM 100 MG PO CAPS
300.0000 mg | ORAL_CAPSULE | ORAL | Status: DC
  Administered 2015-04-18 – 2015-04-19 (×2): 300 mg via ORAL
  Filled 2015-04-17 (×2): qty 3

## 2015-04-17 MED ORDER — DEXTROSE 5 % IV SOLN
1.0000 g | Freq: Once | INTRAVENOUS | Status: AC
  Administered 2015-04-17: 14:00:00 1 g via INTRAVENOUS
  Filled 2015-04-17: qty 10

## 2015-04-17 MED ORDER — ACETAMINOPHEN 650 MG RE SUPP: 650.0000 mg | Freq: Four times a day (QID) | RECTAL | Status: DC | PRN

## 2015-04-17 MED ORDER — TRAMADOL-ACETAMINOPHEN 37.5-325 MG PO TABS
1.0000 | ORAL_TABLET | ORAL | Status: DC | PRN
  Administered 2015-04-17 – 2015-04-20 (×5): 1 via ORAL
  Filled 2015-04-17 (×6): qty 1

## 2015-04-17 MED ORDER — GLIPIZIDE ER 2.5 MG PO TB24
2.5000 mg | ORAL_TABLET | Freq: Every day | ORAL | Status: DC
  Administered 2015-04-18: 12:00:00 2.5 mg via ORAL
  Filled 2015-04-17 (×3): qty 1

## 2015-04-17 MED ORDER — ONDANSETRON HCL 4 MG/2ML IJ SOLN: 4.0000 mg | Freq: Four times a day (QID) | INTRAMUSCULAR | Status: DC | PRN

## 2015-04-17 MED ORDER — INSULIN ASPART 100 UNIT/ML ~~LOC~~ SOLN
0.0000 [IU] | Freq: Three times a day (TID) | SUBCUTANEOUS | Status: DC
  Administered 2015-04-17: 1 [IU] via SUBCUTANEOUS
  Administered 2015-04-19: 2 [IU] via SUBCUTANEOUS
  Filled 2015-04-17: qty 1
  Filled 2015-04-17: qty 2

## 2015-04-17 MED ORDER — SODIUM CHLORIDE 0.9 % IV SOLN
10.0000 mL/h | Freq: Once | INTRAVENOUS | Status: AC
  Administered 2015-04-17: 10 mL/h via INTRAVENOUS

## 2015-04-17 MED ORDER — DIPHENHYDRAMINE HCL 25 MG PO CAPS
25.0000 mg | ORAL_CAPSULE | Freq: Once | ORAL | Status: AC
  Administered 2015-04-17: 25 mg via ORAL
  Filled 2015-04-17: qty 1

## 2015-04-17 MED ORDER — SODIUM CHLORIDE 0.9 % IV SOLN
Freq: Once | INTRAVENOUS | Status: AC
  Administered 2015-04-17: 15:00:00 via INTRAVENOUS

## 2015-04-17 MED ORDER — PANTOPRAZOLE SODIUM 40 MG IV SOLR
40.0000 mg | Freq: Two times a day (BID) | INTRAVENOUS | Status: DC
  Administered 2015-04-17 – 2015-04-20 (×7): 40 mg via INTRAVENOUS
  Filled 2015-04-17 (×7): qty 40

## 2015-04-17 MED ORDER — PRAVASTATIN SODIUM 20 MG PO TABS
80.0000 mg | ORAL_TABLET | Freq: Every day | ORAL | Status: DC
  Administered 2015-04-17 – 2015-04-19 (×3): 80 mg via ORAL
  Filled 2015-04-17 (×3): qty 4

## 2015-04-17 MED ORDER — SODIUM CHLORIDE 0.9 % IV SOLN
INTRAVENOUS | Status: DC
  Administered 2015-04-17 – 2015-04-19 (×6): via INTRAVENOUS

## 2015-04-17 MED ORDER — LORATADINE 10 MG PO TABS
10.0000 mg | ORAL_TABLET | Freq: Every day | ORAL | Status: DC
  Administered 2015-04-17 – 2015-04-19 (×3): 10 mg via ORAL
  Filled 2015-04-17 (×3): qty 1

## 2015-04-17 MED ORDER — INSULIN ASPART 100 UNIT/ML ~~LOC~~ SOLN
3.0000 [IU] | Freq: Three times a day (TID) | SUBCUTANEOUS | Status: DC
  Administered 2015-04-17 – 2015-04-18 (×4): 3 [IU] via SUBCUTANEOUS
  Filled 2015-04-17 (×4): qty 3

## 2015-04-17 MED ORDER — VITAMIN D 1000 UNITS PO TABS
1000.0000 [IU] | ORAL_TABLET | Freq: Every day | ORAL | Status: DC
  Administered 2015-04-17 – 2015-04-19 (×3): 1000 [IU] via ORAL
  Filled 2015-04-17 (×3): qty 1

## 2015-04-17 MED ORDER — AMITRIPTYLINE HCL 25 MG PO TABS
25.0000 mg | ORAL_TABLET | Freq: Two times a day (BID) | ORAL | Status: DC
  Administered 2015-04-17 – 2015-04-20 (×7): 25 mg via ORAL
  Filled 2015-04-17 (×7): qty 1

## 2015-04-17 MED ORDER — INSULIN ASPART 100 UNIT/ML ~~LOC~~ SOLN: 0.0000 [IU] | Freq: Every day | SUBCUTANEOUS | Status: DC

## 2015-04-17 MED ORDER — PERPHENAZINE 2 MG PO TABS
2.0000 mg | ORAL_TABLET | Freq: Two times a day (BID) | ORAL | Status: DC
  Administered 2015-04-17 – 2015-04-20 (×7): 2 mg via ORAL
  Filled 2015-04-17 (×8): qty 1

## 2015-04-17 MED ORDER — ONDANSETRON HCL 4 MG PO TABS: 4.0000 mg | ORAL_TABLET | Freq: Four times a day (QID) | ORAL | Status: DC | PRN

## 2015-04-17 MED ORDER — SODIUM CHLORIDE 0.9 % IV BOLUS (SEPSIS)
1000.0000 mL | Freq: Once | INTRAVENOUS | Status: AC
  Administered 2015-04-17: 1000 mL via INTRAVENOUS

## 2015-04-17 MED ORDER — ACETAMINOPHEN 325 MG PO TABS
650.0000 mg | ORAL_TABLET | Freq: Once | ORAL | Status: AC
  Administered 2015-04-17: 650 mg via ORAL
  Filled 2015-04-17: qty 2

## 2015-04-17 MED ORDER — PERPHENAZINE-AMITRIPTYLINE 2-25 MG PO TABS: 1.0000 | ORAL_TABLET | Freq: Two times a day (BID) | ORAL | Status: DC

## 2015-04-17 NOTE — Plan of Care (Signed)
Problem: Discharge Progression Outcomes Goal: Discharge plan in place and appropriate Outcome: Not Progressing Pt was admitted here on 9/4 for UTI and was d/ced to [facility].  Pt's daughter wasn't happy that pt was d/ced w/fever of 100.6.  Not happy w/[facility].  They may want placement somewhere else.  Pt has hx of mantle cell lymphoma - seen by [facility]/Dr. Grayland Ormond. Pt also has hx of DM, HTN, anxiety, acute on chronic renal failure for which she is getting fluids currently and others controlled by medications.   Goal: Pain controlled with appropriate interventions Outcome: Progressing Pt c/o leg and back pain.  Tramadol ordered.  At times, pt cries out but when you go in she's ok.   Goal: Hemodynamically stable Outcome: Progressing Pt has been having low to normal BP; afebrile, tachycardic.  Goal: Complications resolved/controlled Outcome: Progressing Pt has WBCs of 52.  Urine and blood cultures sent.  Pt will have U/S of bilateral LE and kidneys tomorrow.  Goal: Tolerating diet Outcome: Progressing Pt on clear liquid diet and ate well at supper.  Goal: Activity appropriate for discharge plan Outcome: Progressing Daughter states that until recently, pt was quite active.  At [facility], it was reported she is wheelchair bound.  Would request a PT consult.

## 2015-04-17 NOTE — Care Management (Addendum)
Admitted to [facility] with the diagnosis of anemia. Discharged from this facility  03/24/15 to [facility]. Daughter is Salomon Fick [phone]). Sees Dr Ellison Hughs as her primary care physician Shelbie Ammons RN MSN Care Management [phone]

## 2015-04-17 NOTE — Consult Note (Signed)
[facility]  [address], [place], [place] [address] Phone: [phone] Fax : [phone]  Consultation  Referring Provider:     No ref. provider found Primary Care Physician:  [facility], MD Primary Gastroenterologist:  None         Reason for Consultation:     Anemia  Date of Admission:  04/17/2015 Date of Consultation:  04/17/2015         HPI:   Anna Leonard is a 79 y.o. female who was admitted with severe weakness and confusion. The patient was admitted and found to have a white cell count of 52,000 with a hemoglobin level of 5.8. The patient's ferritin was over thousand and her MCV was normal. The patient did have a stool test that showed her to be heme positive. The patient has a history of mantle cell lymphoma diagnosed approximately September of last year. The patient has had chemotherapy in the past. The patient's last white cell count from 3 weeks ago was 2.3. The patient denies any nausea vomiting fevers or chills. She also denies any black stools or bloody stools. There is no report of any overt sign of GI bleeding as reported by the patient or her daughter. The patient was in the hospital the beginning of this month for urinary tract infection. She is followed by Dr. Grayland Ormond for her lymphoma  Past Medical History  Diagnosis Date  . Hypertension   . Allergy   . Type 2 diabetes mellitus   . HLD (hyperlipidemia)   . Anxiety   . Paralysis     left arm  . Cancer     lymphoma- chemo  . Bell palsy   . Mantle cell lymphoma     Past Surgical History  Procedure Laterality Date  . Vesicovaginal fistula closure w/ tah    . Appendectomy    . Abdominal hysterectomy      Prior to Admission medications   Medication Sig Start Date End Date Taking? Authorizing Provider  aspirin 81 MG chewable tablet Chew 81 mg by mouth daily.   Yes Historical Provider, MD  B Complex-C (B-COMPLEX WITH VITAMIN C) tablet Take 1 tablet by mouth every morning.    Yes Historical Provider, MD  Cholecalciferol 1000 UNITS capsule Take 1,000 Units by mouth daily.   Yes Historical Provider, MD  docusate sodium (COLACE) 100 MG capsule Take 300 mg by mouth every morning.   Yes Historical Provider, MD  glipiZIDE (GLUCOTROL XL) 2.5 MG 24 hr tablet Take 1 tablet (2.5 mg total) by mouth daily with breakfast. 03/26/15  Yes Theodoro Grist, MD  lisinopril-hydrochlorothiazide (PRINZIDE,ZESTORETIC) 20-25 MG per tablet TAKE ONE TABLET BY MOUTH ONCE DAILY 12/13/14  Yes Historical Provider, MD  loratadine (CLARITIN) 10 MG tablet Take 10 mg by mouth daily.   Yes Historical Provider, MD  lovastatin (MEVACOR) 40 MG tablet Take 80 mg by mouth at bedtime.  01/17/14  Yes Historical Provider, MD  metFORMIN (GLUCOPHAGE) 500 MG tablet Take 500 mg by mouth daily with breakfast.   Yes Historical Provider, MD  perphenazine-amitriptyline (ETRAFON/TRIAVIL) 2-25 MG TABS Take 1 tablet by mouth 2 (two) times daily.  07/24/14  Yes Historical Provider, MD    Family History  Problem Relation Age of Onset  . Hypertension Mother   . Cancer Mother 62    Breast  . Cancer Father     Testicular  . Diabetes Sister   . Diabetes Brother      Social History  Substance  Use Topics  . Smoking status: Never Smoker   . Smokeless tobacco: Never Used  . Alcohol Use: No    Allergies as of 04/17/2015 - Review Complete 04/17/2015  Allergen Reaction Noted  . Amoxicillin-pot clavulanate Other (See Comments) 01/17/2015    Review of Systems:    All systems reviewed and negative except where noted in HPI.   Physical Exam:  Vital signs in last 24 hours: Temp:  [98 F (36.7 C)-98.4 F (36.9 C)] 98.2 F (36.8 C) (09/28 1506) Pulse Rate:  [116-126] 120 (09/28 1506) Resp:  [18-24] 20 (09/28 1506) BP: (104-123)/(47-65) 120/47 mmHg (09/28 1506) SpO2:  [93 %-97 %] 97 % (09/28 1506) Weight:  [145 lb (65.772 kg)] 145 lb (65.772 kg) (09/28 0955)   General:   Pleasant, cooperative in NAD Head:  Normocephalic  and atraumatic. Eyes:   No icterus.   Conjunctiva pale. PERRLA. Ears:  Normal auditory acuity. Neck:  Supple; no masses or thyroidomegaly Lungs: Respirations even and unlabored. Lungs clear to auscultation bilaterally.   No wheezes, crackles, or rhonchi.  Heart:  Regular rate and rhythm;  Without murmur, clicks, rubs or gallops Abdomen:  Soft, nondistended, nontender. Normal bowel sounds. No appreciable masses or hepatomegaly.  No rebound or guarding.  Rectal:  Not performed. Msk:  Symmetrical without gross deformities.  Strength  Extremities:  Without edema, cyanosis or clubbing. Neurologic:  Alert and oriented x3;  grossly normal neurologically. Skin:  Intact without significant lesions or rashes. Cervical Nodes:  No significant cervical adenopathy. Psych:  Alert and cooperative. Normal affect.  LAB RESULTS:  Recent Labs  04/17/15 0957 04/17/15 1422  WBC 52.2*  --   HGB 5.8* 6.3*  HCT 17.8*  --   PLT 11*  --    BMET  Recent Labs  04/17/15 0957  NA 130*  K 5.9*  CL 96*  CO2 25  GLUCOSE 169*  BUN 63*  CREATININE 2.29*  CALCIUM 9.3   LFT  Recent Labs  04/17/15 0957  PROT 6.0*  ALBUMIN 2.5*  AST 25  ALT 16  ALKPHOS 225*  BILITOT 0.5   PT/INR No results for input(s): LABPROT, INR in the last 72 hours.  STUDIES: No results found.    Impression / Plan:   Anna Leonard is a 79 y.o. y/o female with with mantle cell lymphoma who now comes in with a normocytic anemia and high ferritin but was found to be strongly heme positive from below. Patient reports that her last colonoscopy was Dr. Nicolasa Ducking over 10 years ago. The patient and her family states that they do not want her to undergo any procedures at this time because she is too weak to undergo them. They state that they would rather wait until she was stronger and preferably as an outpatient. The patient has been told that if she has any sign of acute GI bleeding she may need it sooner. They agree with the  plan. Will await oncology recommendations.  Thank you for involving me in the care of this patient.      LOS: 0 days   Ollen Bowl, MD  04/17/2015, 3:37 PM   Note: This dictation was prepared with Dragon dictation along with smaller phrase technology. Any transcriptional errors that result from this process are unintentional.

## 2015-04-17 NOTE — ED Provider Notes (Addendum)
[facility] Emergency Department Barett Whidbee Note  ____________________________________________  Time seen: Seen upon arrival to the emergency department  I have reviewed the triage vital signs and the nursing notes.   HISTORY  Chief Complaint Anemia    HPI Anna Leonard is a 79 y.o. female with a history of lymphoma in remission but was a concern for relapse presents today with abnormal lab values. She had outpatient labs drawn with a hemoglobin in the fives and a platelet value of 10. She also had an increased white blood cell count to the 50s. She denies any fever at home. Denies any shortness of breath or chest pain. Denies any bleeding in her stool, however does note not having a normal bowel movement over the past 4 days. Her son is at the bedside and says that she may have a gastric mass that is causing some of her GI issues. Dr. Grayland Ormond is her oncologist.   Past Medical History  Diagnosis Date  . Hypertension   . Allergy   . Type 2 diabetes mellitus   . HLD (hyperlipidemia)   . Anxiety   . Paralysis     left arm  . Cancer     lymphoma- chemo  . Bell palsy   . Mantle cell lymphoma     Patient Active Problem List   Diagnosis Date Noted  . Acute cystitis without hematuria 03/26/2015  . Leukopenia 03/26/2015  . Neutropenia 03/26/2015  . Anemia 03/26/2015  . Chronic renal insufficiency 03/26/2015  . Generalized weakness 03/26/2015  . Mantle cell lymphoma 03/26/2015  . Sepsis 03/24/2015  . CVA (cerebral vascular accident) 01/27/2015  . SIRS (systemic inflammatory response syndrome) 01/27/2015  . TBI (traumatic brain injury) 01/27/2015  . Diabetes mellitus, type 2 01/17/2015  . HLD (hyperlipidemia) 01/17/2015  . Benign essential HTN 01/17/2015  . Anxiety 01/10/2014    Past Surgical History  Procedure Laterality Date  . Vesicovaginal fistula closure w/ tah    . Appendectomy    . Abdominal hysterectomy      Current Outpatient Rx   Name  Route  Sig  Dispense  Refill  . aspirin 81 MG chewable tablet   Oral   Chew 81 mg by mouth daily.         . B Complex-C (B-COMPLEX WITH VITAMIN C) tablet   Oral   Take 1 tablet by mouth every morning.         . Cholecalciferol 1000 UNITS capsule   Oral   Take 1,000 Units by mouth daily.         Marland Kitchen docusate sodium (COLACE) 100 MG capsule   Oral   Take 300 mg by mouth every morning.         Marland Kitchen glipiZIDE (GLUCOTROL XL) 2.5 MG 24 hr tablet   Oral   Take 1 tablet (2.5 mg total) by mouth daily with breakfast.   30 tablet   3   . lisinopril-hydrochlorothiazide (PRINZIDE,ZESTORETIC) 20-25 MG per tablet      TAKE ONE TABLET BY MOUTH ONCE DAILY         . loratadine (CLARITIN) 10 MG tablet   Oral   Take 10 mg by mouth daily.         Marland Kitchen lovastatin (MEVACOR) 40 MG tablet   Oral   Take 80 mg by mouth at bedtime.          . metFORMIN (GLUCOPHAGE) 500 MG tablet   Oral   Take 500 mg by mouth daily  with breakfast.         . perphenazine-amitriptyline (ETRAFON/TRIAVIL) 2-25 MG TABS   Oral   Take 1 tablet by mouth 2 (two) times daily.            Allergies Amoxicillin-pot clavulanate  Family History  Problem Relation Age of Onset  . Hypertension Mother   . Cancer Mother 64    Breast  . Cancer Father     Testicular  . Diabetes Sister   . Diabetes Brother     Social History Social History  Substance Use Topics  . Smoking status: Never Smoker   . Smokeless tobacco: Never Used  . Alcohol Use: No    Review of Systems Constitutional: No fever/chills Eyes: No visual changes. ENT: No sore throat. Cardiovascular: Denies chest pain. Respiratory: Denies shortness of breath. Gastrointestinal: No abdominal pain.  No nausea, no vomiting.  No diarrhea.  No constipation. Genitourinary: Negative for dysuria. Musculoskeletal: Negative for back pain. Skin: Negative for rash. Neurological: Negative for headaches, focal weakness or numbness.  10-point ROS  otherwise negative.  ____________________________________________   PHYSICAL EXAM:  VITAL SIGNS: ED Triage Vitals  Enc Vitals Group     BP 04/17/15 0955 123/61 mmHg     Pulse Rate 04/17/15 0955 126     Resp 04/17/15 0955 18     Temp 04/17/15 0955 98.4 F (36.9 C)     Temp Source 04/17/15 0955 Oral     SpO2 04/17/15 0949 95 %     Weight 04/17/15 0955 145 lb (65.772 kg)     Height 04/17/15 0955 5\' 2"  (1.575 m)     Head Cir --      Peak Flow --      Pain Score --      Pain Loc --      Pain Edu? --      Excl. in Day? --      Constitutional: Alert and oriented. Well appearing and in no acute distress. Eyes: Pale sclera. PERRL. EOMI. Head: Atraumatic. Nose: No congestion/rhinnorhea. Mouth/Throat: Mucous membranes are moist.  Oropharynx non-erythematous. Neck: No stridor.   Cardiovascular: Normal rate, regular rhythm. Grossly normal heart sounds.  Good peripheral circulation. Respiratory: Normal respiratory effort.  No retractions. Lungs CTAB. Gastrointestinal: Soft and nontender. No distention. No abdominal bruits. No CVA tenderness. Grossly brown stool which is strongly heme positive. Musculoskeletal: No lower extremity tenderness nor edema.  No joint effusions. Neurologic:  Normal speech and language. No gross focal neurologic deficits are appreciated. No gait instability. Skin:  Skin is warm, dry and intact. No rash noted. Psychiatric: Mood and affect are normal. Speech and behavior are normal.  ____________________________________________   LABS (all labs ordered are listed, but only abnormal results are displayed)  Labs Reviewed  CBC WITH DIFFERENTIAL/PLATELET - Abnormal; Notable for the following:    WBC 52.2 (*)    RBC 2.14 (*)    Hemoglobin 5.8 (*)    HCT 17.8 (*)    RDW 15.9 (*)    Platelets 11 (*)    nRBC 1 (*)    Lymphs Abs 7.8 (*)    Monocytes Absolute 0.0 (*)    All other components within normal limits  COMPREHENSIVE METABOLIC PANEL - Abnormal;  Notable for the following:    Sodium 130 (*)    Potassium 5.9 (*)    Chloride 96 (*)    Glucose, Bld 169 (*)    BUN 63 (*)    Creatinine, Ser 2.29 (*)  Total Protein 6.0 (*)    Albumin 2.5 (*)    Alkaline Phosphatase 225 (*)    GFR calc non Af Amer 19 (*)    GFR calc Af Amer 23 (*)    All other components within normal limits  CULTURE, BLOOD (ROUTINE X 2)  CULTURE, BLOOD (ROUTINE X 2)  URINE CULTURE  TROPONIN I  PATHOLOGIST SMEAR REVIEW  URINALYSIS COMPLETEWITH MICROSCOPIC ([facility] ONLY)  TYPE AND SCREEN  ABO/RH  PREPARE RBC (CROSSMATCH)   ____________________________________________  EKG  ED ECG REPORT I, Doran Stabler, the attending physician, personally viewed and interpreted this ECG.   Date: 04/17/2015  EKG Time: 1014  Rate: 122  Rhythm: sinus tachycardia  Axis: Normal axis  Intervals:none  ST&T Change: No ST elevations or depressions. No abnormal T-wave inversions   ____________________________________________  RADIOLOGY  ____________________________________________   PROCEDURES  CRITICAL CARE Performed by: Doran Stabler   Total critical care time: 35 minutes  Critical care time was exclusive of separately billable procedures and treating other patients.  Critical care was necessary to treat or prevent imminent or life-threatening deterioration.  Critical care was time spent personally by me on the following activities: development of treatment plan with patient and/or surrogate as well as nursing, discussions with consultants, evaluation of patient's response to treatment, examination of patient, obtaining history from patient or surrogate, ordering and performing treatments and interventions, ordering and review of laboratory studies, ordering and review of radiographic studies, pulse oximetry and re-evaluation of patient's condition.  Critical care done for low hemoglobin. ____________________________________________   INITIAL  IMPRESSION / ASSESSMENT AND PLAN / ED COURSE  Pertinent labs & imaging results that were available during my care of the patient were reviewed by me and considered in my medical decision making (see chart for details).  ----------------------------------------- 12:26 PM on 04/17/2015 -----------------------------------------  Patient be transfused 2 units of red blood cells. Also an acute renal failure with elevated potassium. No peak T waves on the EKG. We'll give fluids and transfused. Prerenal pattern on the electrolytes. Believe that this is the cause of the hyperkalemia. We'll not treat aggressively secondary to no EKG changes and fluid ordered. Patient and family updated about the lab findings and need for admission. Signed out to Dr. Posey Pronto for admission. ____________________________________________   FINAL CLINICAL IMPRESSION(S) / ED DIAGNOSES  Acute leukocytosis with lymphocyte predominance. Acute renal failure. Acute hyperkalemia. Initial visit.    Orbie Pyo, MD 04/17/15 1227  Patient also actively being treated for urinary tract infection. On Macrobid as an outpatient. Ordered ceftriaxone.  Orbie Pyo, MD 04/17/15 1228  Feel that the patient's lab abnormalities are likely from her return of her lymphoma.  Orbie Pyo, MD 04/17/15 [phone]

## 2015-04-17 NOTE — ED Notes (Signed)
Patient sent from [facility] via EMS for abnormal blood work. Paper work sent reports WBC - 51.6, HGB - 5.8, platelet count - 10.  Patient is alert x3 upon  Arrival.

## 2015-04-17 NOTE — H&P (Addendum)
[place] [facility] [place] NAME: Anna Leonard    MR#:  [id number]  DATE OF BIRTH:  June 08, 1936  DATE OF ADMISSION:  04/17/2015  PRIMARY CARE PHYSICIAN: [facility], Chrissie Noa, MD   REQUESTING/REFERRING PHYSICIAN: DR Lucita Lora  CHIEF COMPLAINT:   Chief Complaint  Patient presents with  . Anemia    HISTORY OF PRESENT ILLNESS: Anna Leonard  is a 79 y.o. female with a known history of mantle cell lymphoma with recent CT scan revealing recurrence of lymphoma presents back to the hospital with severe weakness, left extremity swelling and confusion. According to patient's daughter, as patient herself is not able to provide history due to medical condition. Patient was admitted on December on 03/24/2015 for urinary tract infection. She was discharged to [facility]. She did relatively well for the 2 weeks. However, over the past few days she's been having problems with increasing confusion, generalized weakness and lower extremity swelling. Labs were taken and patient was noted to have acute on chronic renal failure, hyperkalemia, hyponatremia, elevated white blood cell count 52,000 and severe anemia with hemoglobin level of 5.8. Patient was with strongly heme occult positive on rectal exam in the emergency room. Hospitalist services were contacted for admission    PAST MEDICAL HISTORY:   Past Medical History  Diagnosis Date  . Hypertension   . Allergy   . Type 2 diabetes mellitus   . HLD (hyperlipidemia)   . Anxiety   . Paralysis     left arm  . Cancer     lymphoma- chemo  . Bell palsy   . Mantle cell lymphoma     PAST SURGICAL HISTORY:  Past Surgical History  Procedure Laterality Date  . Vesicovaginal fistula closure w/ tah    . Appendectomy    . Abdominal hysterectomy      SOCIAL HISTORY:  Social History  Substance Use Topics  . Smoking status: Never Smoker   . Smokeless tobacco: Never Used  . Alcohol Use: No    FAMILY HISTORY:   Family History  Problem Relation Age of Onset  . Hypertension Mother   . Cancer Mother 35    Breast  . Cancer Father     Testicular  . Diabetes Sister   . Diabetes Brother     DRUG ALLERGIES:  Allergies  Allergen Reactions  . Amoxicillin-Pot Clavulanate Other (See Comments)    diarrhea    Review of Systems  Constitutional: Positive for malaise/fatigue. Negative for fever, chills and weight loss.  HENT: Negative for congestion.   Eyes: Positive for blurred vision. Negative for double vision.  Respiratory: Negative for cough, sputum production, shortness of breath and wheezing.   Cardiovascular: Positive for leg swelling. Negative for chest pain, palpitations, orthopnea and PND.  Gastrointestinal: Positive for abdominal pain and constipation. Negative for nausea, vomiting, diarrhea, blood in stool and melena.  Genitourinary: Positive for dysuria and frequency. Negative for urgency and hematuria.  Musculoskeletal: Positive for joint pain. Negative for falls.  Skin: Negative for rash.  Neurological: Positive for weakness. Negative for dizziness.  Psychiatric/Behavioral: Negative for depression and memory loss. The patient is not nervous/anxious.     MEDICATIONS AT HOME:  Prior to Admission medications   Medication Sig Start Date End Date Taking? Authorizing Provider  aspirin 81 MG chewable tablet Chew 81 mg by mouth daily.   Yes Historical Provider, MD  B Complex-C (B-COMPLEX WITH VITAMIN C) tablet Take 1 tablet by mouth every morning.  Yes Historical Provider, MD  Cholecalciferol 1000 UNITS capsule Take 1,000 Units by mouth daily.   Yes Historical Provider, MD  docusate sodium (COLACE) 100 MG capsule Take 300 mg by mouth every morning.   Yes Historical Provider, MD  glipiZIDE (GLUCOTROL XL) 2.5 MG 24 hr tablet Take 1 tablet (2.5 mg total) by mouth daily with breakfast. 03/26/15  Yes Theodoro Grist, MD  lisinopril-hydrochlorothiazide (PRINZIDE,ZESTORETIC) 20-25 MG per tablet TAKE  ONE TABLET BY MOUTH ONCE DAILY 12/13/14  Yes Historical Provider, MD  loratadine (CLARITIN) 10 MG tablet Take 10 mg by mouth daily.   Yes Historical Provider, MD  lovastatin (MEVACOR) 40 MG tablet Take 80 mg by mouth at bedtime.  01/17/14  Yes Historical Provider, MD  metFORMIN (GLUCOPHAGE) 500 MG tablet Take 500 mg by mouth daily with breakfast.   Yes Historical Provider, MD  perphenazine-amitriptyline (ETRAFON/TRIAVIL) 2-25 MG TABS Take 1 tablet by mouth 2 (two) times daily.  07/24/14  Yes Historical Provider, MD      PHYSICAL EXAMINATION:   VITAL SIGNS: Blood pressure 112/65, pulse 121, temperature 98.4 F (36.9 C), temperature source Oral, resp. rate 23, height 5\' 2"  (1.575 m), weight 65.772 kg (145 lb), SpO2 93 %.  GENERAL:  79 y.o.-year-old very pale and weak patient lying in the bed with no acute distress.  EYES: Pupils equal, round, reactive to light and accommodation. No scleral icterus. Extraocular muscles intact.  HEENT: Head atraumatic, normocephalic. Oropharynx and nasopharynx clear.  NECK:  Supple, no jugular venous distention. No thyroid enlargement, no tenderness.  LUNGS: Normal breath sounds bilaterally, anteriorly, diminished at bases. Poor respiratory effort.  No wheezing, rales,rhonchi or crepitation. No use of accessory muscles of respiration.  CARDIOVASCULAR: S1, S2 normal. Tachycardic . No murmurs, rubs, or gallops.  ABDOMEN: Soft, tender in lower abdomen, but no rebound , but some voluntary guarding was noted, nondistended. Bowel sounds present. No organomegaly or mass.  EXTREMITIES: 2-3+ lower extremity and pedal edema, no cyanosis, or clubbing. Tender on palpation and calf area.  NEUROLOGIC: Cranial nerves II through XII are intact. Muscle strength 5/5 in all extremities. Sensation intact. Gait not checked.  PSYCHIATRIC: The patient is alert and oriented x 3. Somewhat distracted and forgetful SKIN: No obvious rash, lesion, or ulcer.   LABORATORY PANEL:   CBC  Recent  Labs Lab 04/17/15 0957  WBC 52.2*  HGB 5.8*  HCT 17.8*  PLT 11*  MCV 83.0  MCH 27.1  MCHC 32.7  RDW 15.9*  LYMPHSABS 7.8*  MONOABS 0.0*  EOSABS 0.0  BASOSABS 0.0   ------------------------------------------------------------------------------------------------------------------  Chemistries   Recent Labs Lab 04/17/15 0957  NA 130*  K 5.9*  CL 96*  CO2 25  GLUCOSE 169*  BUN 63*  CREATININE 2.29*  CALCIUM 9.3  AST 25  ALT 16  ALKPHOS 225*  BILITOT 0.5   ------------------------------------------------------------------------------------------------------------------  Cardiac Enzymes  Recent Labs Lab 04/17/15 0957  TROPONINI <0.03   ------------------------------------------------------------------------------------------------------------------  RADIOLOGY: No results found.  EKG: Orders placed or performed during the hospital encounter of 04/17/15  . ED EKG  . ED EKG  . EKG 12-Lead  . EKG 12-Lead    IMPRESSION AND PLAN:  Active Problems:   Anemia  1.acute on chronic likely posthemorrhagic anemia. Admit patient medical floor. Check iron and ferritin level. Transfuse patient with packed red blood cells, follow hemoglobin level every 8 hours 2. Gastrointestinal bleed. Initiate patient on proton pump inhibitor. Get gastroenterologist involved for further recommendations. Stop aspirin therapy 3. Acute on chronic renal failure  likely due to severe anemia as well as likely metformin as and ACE inhibitor use. Initiate patient on IV fluids and follow kidney function gets better scan done to rule out retention and place Foley catheter if retention is present.  4. Hyperkalemia due to acute renal failure. IV fluids and follow patient's potassium level closely. Kayexalate if needed 5. Hyponatremia, likely element of dehydration. Continue IV fluids and follow sodium level 6. Leukocytosis, unclear etiology, get urine cultures and urinalysis as well as chest x-ray.  Initiate antibiotic therapy 7. , Thrombocytopenia, transfuse patient with platelets. Likely due to recurrent lymphoma.  Get oncologist involved for further recommendations 8. Pyuria, likely urinary tract infection. Continue patient on the Rocephin, which is initiated in emergency room. Urine cultures as well as blood cultures were taken. Follow culture results  All the records are reviewed and case discussed with ED provider. Management plans discussed with the patient, family and they are in agreement.  CODE STATUS:  Full code  TOTAL CRITICAL CARE TIME TAKING CARE OF THIS PATIENT: 75 minutes.    Theodoro Grist M.D on 04/17/2015 at 12:36 PM  Between 7am to 6pm - Pager - [phone] After 6pm go to [email or web address] - password [email or web address] [facility]  [place] [facility]  Office  [phone]  CC: Primary care physician; [facility], Chrissie Noa, MD

## 2015-04-18 ENCOUNTER — Ambulatory Visit: Payer: Commercial Managed Care - HMO | Admitting: Oncology

## 2015-04-18 ENCOUNTER — Other Ambulatory Visit: Payer: Commercial Managed Care - HMO

## 2015-04-18 ENCOUNTER — Inpatient Hospital Stay: Payer: Commercial Managed Care - HMO

## 2015-04-18 LAB — GLUCOSE, CAPILLARY
GLUCOSE-CAPILLARY: 104 mg/dL — AB (ref 65–99)
GLUCOSE-CAPILLARY: 69 mg/dL (ref 65–99)
GLUCOSE-CAPILLARY: 88 mg/dL (ref 65–99)
Glucose-Capillary: 101 mg/dL — ABNORMAL HIGH (ref 65–99)
Glucose-Capillary: 89 mg/dL (ref 65–99)

## 2015-04-18 LAB — BASIC METABOLIC PANEL
ANION GAP: 7 (ref 5–15)
BUN: 57 mg/dL — ABNORMAL HIGH (ref 6–20)
CALCIUM: 8.9 mg/dL (ref 8.9–10.3)
CO2: 25 mmol/L (ref 22–32)
Chloride: 102 mmol/L (ref 101–111)
Creatinine, Ser: 2.16 mg/dL — ABNORMAL HIGH (ref 0.44–1.00)
GFR, EST AFRICAN AMERICAN: 24 mL/min — AB (ref >60)
GFR, EST NON AFRICAN AMERICAN: 21 mL/min — AB (ref >60)
Glucose, Bld: 105 mg/dL — ABNORMAL HIGH (ref 65–99)
Potassium: 4.8 mmol/L (ref 3.5–5.1)
SODIUM: 134 mmol/L — AB (ref 135–145)

## 2015-04-18 LAB — TYPE AND SCREEN
ABO/RH(D): O POS
Antibody Screen: NEGATIVE
Unit division: 0
Unit division: 0

## 2015-04-18 LAB — CBC
HCT: 24.1 % — ABNORMAL LOW (ref 35.0–47.0)
HEMOGLOBIN: 8.2 g/dL — AB (ref 12.0–16.0)
MCH: 27.4 pg (ref 26.0–34.0)
MCHC: 33.9 g/dL (ref 32.0–36.0)
MCV: 80.7 fL (ref 80.0–100.0)
Platelets: 39 10*3/uL — ABNORMAL LOW (ref 150–440)
RBC: 2.99 MIL/uL — AB (ref 3.80–5.20)
RDW: 14.6 % — ABNORMAL HIGH (ref 11.5–14.5)
WBC: 45.2 10*3/uL — ABNORMAL HIGH (ref 3.6–11.0)

## 2015-04-18 LAB — PREPARE PLATELET PHERESIS: Unit division: 0

## 2015-04-18 LAB — HEMOGLOBIN: HEMOGLOBIN: 8.5 g/dL — AB (ref 12.0–16.0)

## 2015-04-18 NOTE — Consult Note (Signed)
[place]  [phone] [phone]  ID: Anna Leonard OB: 07-17-1936  MR#: [id number]  SKA#:[id number]  Patient Care Team: Sofie Hartigan, MD as PCP - General (Family Medicine)  CHIEF COMPLAINT:  Chief Complaint  Patient presents with  . Anemia    INTERVAL HISTORY: Patient is a 79 year old female with a known history of mantle cell lymphoma who last had chemotherapy in March of 2016.  She was recently admitted to the hospital and found to have significant progression of disease on CT scan.  She was admitted yesterday with AMS and confusion. Patient was not available for full consultation today.  REVIEW OF SYSTEMS:   Review of Systems  Unable to perform ROS Genitourinary: Positive for urgency.    As per HPI. Otherwise, a complete review of systems is negatve.  PAST MEDICAL HISTORY: Past Medical History  Diagnosis Date  . Hypertension   . Allergy   . Type 2 diabetes mellitus   . HLD (hyperlipidemia)   . Anxiety   . Paralysis     left arm  . Cancer     lymphoma- chemo  . Bell palsy   . Mantle cell lymphoma     PAST SURGICAL HISTORY: Past Surgical History  Procedure Laterality Date  . Vesicovaginal fistula closure w/ tah    . Appendectomy    . Abdominal hysterectomy      FAMILY HISTORY Family History  Problem Relation Age of Onset  . Hypertension Mother   . Cancer Mother 69    Breast  . Cancer Father     Testicular  . Diabetes Sister   . Diabetes Brother        ADVANCED DIRECTIVES:    HEALTH MAINTENANCE: Social History  Substance Use Topics  . Smoking status: Never Smoker   . Smokeless tobacco: Never Used  . Alcohol Use: No     Colonoscopy:  PAP:  Bone density:  Lipid panel:  Allergies  Allergen Reactions  . Amoxicillin-Pot Clavulanate Other (See Comments)    diarrhea    Current Facility-Administered Medications  Medication Dose Route Frequency Provider Last Rate Last Dose  . 0.9 %   sodium chloride infusion   Intravenous Continuous Theodoro Grist, MD 150 mL/hr at 04/18/15 0925    . acetaminophen (TYLENOL) tablet 650 mg  650 mg Oral Q6H PRN Theodoro Grist, MD       Or  . acetaminophen (TYLENOL) suppository 650 mg  650 mg Rectal Q6H PRN Theodoro Grist, MD      . perphenazine (TRILAFON) tablet 2 mg  2 mg Oral BID Theodoro Grist, MD   2 mg at 04/17/15 2307   And  . amitriptyline (ELAVIL) tablet 25 mg  25 mg Oral BID Theodoro Grist, MD   25 mg at 04/17/15 2307  . cholecalciferol (VITAMIN D) tablet 1,000 Units  1,000 Units Oral Daily Theodoro Grist, MD   1,000 Units at 04/17/15 1551  . docusate sodium (COLACE) capsule 300 mg  300 mg Oral BH-q7a Theodoro Grist, MD      . glipiZIDE (GLUCOTROL XL) 24 hr tablet 2.5 mg  2.5 mg Oral Q breakfast Theodoro Grist, MD      . insulin aspart (novoLOG) injection 0-5 Units  0-5 Units Subcutaneous QHS Theodoro Grist, MD   0 Units at 04/17/15 2204  . insulin aspart (novoLOG) injection 0-9 Units  0-9 Units Subcutaneous TID WC Theodoro Grist, MD   1 Units at 04/17/15 1701  . insulin aspart (novoLOG) injection 3 Units  3 Units Subcutaneous TID WC Theodoro Grist, MD   3 Units at 04/18/15 0926  . loratadine (CLARITIN) tablet 10 mg  10 mg Oral Daily Theodoro Grist, MD   10 mg at 04/17/15 1551  . ondansetron (ZOFRAN) tablet 4 mg  4 mg Oral Q6H PRN Theodoro Grist, MD       Or  . ondansetron (ZOFRAN) injection 4 mg  4 mg Intravenous Q6H PRN Theodoro Grist, MD      . pantoprazole (PROTONIX) injection 40 mg  40 mg Intravenous Q12H Theodoro Grist, MD   40 mg at 04/17/15 2308  . pravastatin (PRAVACHOL) tablet 80 mg  80 mg Oral q1800 Theodoro Grist, MD   80 mg at 04/17/15 1700  . traMADol-acetaminophen (ULTRACET) 37.5-325 MG per tablet 1 tablet  1 tablet Oral Q4H PRN Theodoro Grist, MD   1 tablet at 04/17/15 1835    OBJECTIVE: Filed Vitals:   04/18/15 0442  BP: 105/57  Pulse: 108  Temp: 97.7 F (36.5 C)  Resp: 18     Body mass index is 26.51 kg/(m^2).    ECOG FS:3 -  Symptomatic, >50% confined to bed    LAB RESULTS:  Lab Results  Component Value Date   NA 134* 04/18/2015   K 4.8 04/18/2015   CL 102 04/18/2015   CO2 25 04/18/2015   GLUCOSE 105* 04/18/2015   BUN 57* 04/18/2015   CREATININE 2.16* 04/18/2015   CALCIUM 8.9 04/18/2015   PROT 6.0* 04/17/2015   ALBUMIN 2.5* 04/17/2015   AST 25 04/17/2015   ALT 16 04/17/2015   ALKPHOS 225* 04/17/2015   BILITOT 0.5 04/17/2015   GFRNONAA 21* 04/18/2015   GFRAA 24* 04/18/2015    Lab Results  Component Value Date   WBC 45.2* 04/18/2015   NEUTROABS 2.6 04/17/2015   HGB 8.2* 04/18/2015   HCT 24.1* 04/18/2015   MCV 80.7 04/18/2015   PLT 39* 04/18/2015     STUDIES: Ct Head Wo Contrast  03/24/2015   CLINICAL DATA:  Weakness, fever, burning with urination for 2-3 weeks.  EXAM: CT HEAD WITHOUT CONTRAST  TECHNIQUE: Contiguous axial images were obtained from the base of the skull through the vertex without intravenous contrast.  COMPARISON:  01/27/2015  FINDINGS: There is no evidence of mass effect, midline shift, or extra-axial fluid collections. There is no evidence of a space-occupying lesion or intracranial hemorrhage. There is no evidence of a cortical-based area of acute infarction. There is an old right frontoparietal encephalomalacia. There is periventricular white matter low attenuation likely secondary to microangiopathy.  The ventricles and sulci are appropriate for the patient's age. The basal cisterns are patent.  Visualized portions of the orbits are unremarkable. The visualized portions of the paranasal sinuses and mastoid air cells are unremarkable. Cerebrovascular atherosclerotic calcifications are noted.  There is evidence of prior right frontoparietal craniectomy. There is no acute osseous abnormality.  IMPRESSION: No acute intracranial pathology.  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   Electronically Signed   By: Kathreen Devoid   On: 03/24/2015 14:10   Ct Abdomen Pelvis W  Contrast  03/24/2015   CLINICAL DATA:  Fever, weakness, burning with urination for 2-3 weeks. History of lymphoma.  EXAM: CT ABDOMEN AND PELVIS WITH CONTRAST  TECHNIQUE: Multidetector CT imaging of the abdomen and pelvis was performed using the standard protocol following bolus administration of intravenous contrast.  CONTRAST:  171mL OMNIPAQUE IOHEXOL 300 MG/ML  SOLN  COMPARISON:  PET-CT 10/15/2014  FINDINGS: Lower chest: Mild chronic interstitial disease at the  left lung base. No pleural effusion. Normal heart size.  Hepatobiliary: Normal liver. Gallbladder filled with cholelithiasis. Gallbladder is otherwise normal.  Pancreas: Normal pancreas.  Spleen: Normal spleen.  Adrenals/Urinary Tract: Normal adrenal glands. No obstructive uropathy. No nephrolithiasis. There are numerous hypodense masses throughout bilateral kidneys with the largest in the right kidney measuring 2.4 cm and the largest in the left kidney measuring 3.6 cm. There is circumferential bladder wall thickening.  Stomach/Bowel: No bowel wall thickening or dilatation. No bowel obstruction. No pneumatosis, pneumoperitoneum or portal venous gas. No abdominal pelvic free fluid. There is a small fat containing umbilical hernia.  Vascular/Lymphatic: Normal caliber abdominal aorta. Large retroperitoneal soft tissue mass measuring approximately 9.1 x 6.7 x 11.2 cm encasing the distal portion of the abdominal aorta and common iliac arteries as well as the IVC. There is a large presacral soft tissue mass measuring 8.3 x 5.3 cm encasing the right S3 nerve root. There is a left cardiophrenic mass measuring 1.3 x 3.4 cm most consistent with an enlarged lymph node.  Reproductive: Prior hysterectomy.  No adnexal mass.  Other: There is a 7 x 3.9 cm soft tissue mass in the subcutaneous fat of the left lower back.  Musculoskeletal: There is expansion of the left iliacus muscle, left gluteus maximus muscle and left gluteus medius muscle. There is no aggressive lytic  or sclerotic osseous lesion. There is an ununited insufficiency fracture of the right inferior pubic ramus and right pubic symphysis. There is increased soft tissue density within the right L5-S1 neural foramen and extending into the epidural space.  IMPRESSION: 1. The overall appearance of the abdomen/pelvis is most consistent with severe recurrence of lymphoma as described above. 2. There are multiple hypodense masses throughout bilateral kidneys most concerning for lymphomatous involvement. 3. There is increased soft tissue density within the right L5-S1 neural foramen and extending into the epidural space likely reflecting lymphoma recurrence. 4. Cholelithiasis.   Electronically Signed   By: Kathreen Devoid   On: 03/24/2015 14:35   Dg Chest Port 1 View  04/17/2015   CLINICAL DATA:  Leukocytosis.  Anemia.  Lymphoma.  EXAM: PORTABLE CHEST 1 VIEW  COMPARISON:  01/27/2015  FINDINGS: The heart size and mediastinal contours are within normal limits. No evidence of pulmonary consolidation or edema. Old left rib fracture deformities and mild left pleural thickening again noted.  IMPRESSION: No active disease.   Electronically Signed   By: Earle Gell M.D.   On: 04/17/2015 18:37    ASSESSMENT: Likely recurrent Mantle Cell Lymphoma  PLAN:    1. Mantle cell lymphoma: CT scan results as above reviewed independently highly suspicious for recurrent mantle cell. Patient last received chemotherapy with Rituxan and Treanda in February 2016. Her pancytopenia is also likely progression of disease.  Given her decrease performance status any further treatments would be difficult.  Recommend Palliative Care consult to discuss treatment goals.  I will further discuss with patient when she is available for consultation.  2.  Anemia:  Likely progression of disease.  Continue to transfuse if hbg falls below 7.0 3.  Thrombocytopenia:  Likely progression of disease.  Transfuse if platelets fall below 10,000 or there are overt  signs of bleeding.   Appreciate consult. Will follow.   Lloyd Huger, MD   04/18/2015 11:25 AM

## 2015-04-18 NOTE — Progress Notes (Signed)
[facility] [place] NAME: Anna Leonard    MR#:  [id number]  DATE OF BIRTH:  11-Jan-1936  SUBJECTIVE:  CHIEF COMPLAINT:   Chief Complaint  Patient presents with  . Anemia   Feels close to normal. Some fatigue. Waiting for prognosis about her lymphoma REVIEW OF SYSTEMS:    Review of Systems  Constitutional: Positive for malaise/fatigue. Negative for fever and chills.  HENT: Negative for sore throat.   Eyes: Negative for blurred vision, double vision and pain.  Respiratory: Negative for cough, hemoptysis, shortness of breath and wheezing.   Cardiovascular: Positive for leg swelling. Negative for chest pain, palpitations and orthopnea.  Gastrointestinal: Negative for heartburn, nausea, vomiting, abdominal pain, diarrhea and constipation.  Genitourinary: Negative for dysuria and hematuria.  Musculoskeletal: Negative for back pain and joint pain.  Skin: Negative for rash.  Neurological: Positive for weakness. Negative for sensory change, speech change, focal weakness and headaches.  Endo/Heme/Allergies: Does not bruise/bleed easily.  Psychiatric/Behavioral: Negative for depression. The patient is not nervous/anxious.       DRUG ALLERGIES:   Allergies  Allergen Reactions  . Amoxicillin-Pot Clavulanate Other (See Comments)    diarrhea    VITALS:  Blood pressure 110/54, pulse 122, temperature 98.5 F (36.9 C), temperature source Oral, resp. rate 18, height 5\' 2"  (1.575 m), weight 65.772 kg (145 lb), SpO2 94 %.  PHYSICAL EXAMINATION:   Physical Exam  GENERAL:  79 y.o.-year-old patient lying in the bed with no acute distress.  EYES: Pupils equal, round, reactive to light and accommodation. No scleral icterus. Extraocular muscles intact.  HEENT: Head atraumatic, normocephalic. Oropharynx and nasopharynx clear.  NECK:  Supple, no jugular venous distention. No thyroid enlargement, no tenderness.  LUNGS: Normal breath sounds  bilaterally, no wheezing, rales, rhonchi. No use of accessory muscles of respiration.  CARDIOVASCULAR: S1, S2 normal. No murmurs, rubs, or gallops.  ABDOMEN: Soft, nontender, nondistended. Bowel sounds present. No organomegaly or mass.  EXTREMITIES: No cyanosis, clubbing. 3+ edema   NEUROLOGIC: Cranial nerves II through XII are intact. No focal Motor or sensory deficits b/l.   PSYCHIATRIC: The patient is alert and awake SKIN: No obvious rash, lesion, or ulcer.    LABORATORY PANEL:   CBC  Recent Labs Lab 04/18/15 0423 04/18/15 1238  WBC 45.2*  --   HGB 8.2* 8.5*  HCT 24.1*  --   PLT 39*  --    ------------------------------------------------------------------------------------------------------------------  Chemistries   Recent Labs Lab 04/17/15 0957  04/18/15 0423  NA 130*  < > 134*  K 5.9*  < > 4.8  CL 96*  < > 102  CO2 25  < > 25  GLUCOSE 169*  < > 105*  BUN 63*  < > 57*  CREATININE 2.29*  < > 2.16*  CALCIUM 9.3  < > 8.9  AST 25  --   --   ALT 16  --   --   ALKPHOS 225*  --   --   BILITOT 0.5  --   --   < > = values in this interval not displayed. ------------------------------------------------------------------------------------------------------------------  Cardiac Enzymes  Recent Labs Lab 04/17/15 0957  TROPONINI <0.03   ------------------------------------------------------------------------------------------------------------------  RADIOLOGY:  US Renal  04/18/2015   CLINICAL DATA:  Acute on chronic renal failure.  EXAM: RENAL / URINARY TRACT ULTRASOUND COMPLETE  COMPARISON:  03/24/2015  FINDINGS: Right Kidney:  Length: 10.4 cm. Focal hypoechoic mass within the midpole measures 1.8 x 1.6 x 1.8  cm. No hydronephrosis identified.  Left Kidney:  Length: 11.4 cm. Echogenicity within normal limits. No mass or hydronephrosis visualized.  Bladder:  Debris noted within the urinary bladder.  Other:  Small left pleural effusion noted.  IMPRESSION: 1. No evidence  of hydronephrosis to explain patient's acute on chronic renal failure. 2. Focal hypoechoic mass within the mid right kidney corresponding with previously noted suspected lymphoma involvement.   Electronically Signed   By: Kerby Moors M.D.   On: 04/18/2015 11:45   US Venous Img Lower Bilateral  04/18/2015   CLINICAL DATA:  Bilateral lower extremity swelling.  EXAM: BILATERAL LOWER EXTREMITY VENOUS DOPPLER ULTRASOUND  TECHNIQUE: Gray-scale sonography with graded compression, as well as color Doppler and duplex ultrasound were performed to evaluate the lower extremity deep venous systems from the level of the common femoral vein and including the common femoral, femoral, profunda femoral, popliteal and calf veins including the posterior tibial, peroneal and gastrocnemius veins when visible. The superficial great saphenous vein was also interrogated. Spectral Doppler was utilized to evaluate flow at rest and with distal augmentation maneuvers in the common femoral, femoral and popliteal veins.  COMPARISON:  None.  FINDINGS: RIGHT LOWER EXTREMITY  Common Femoral Vein: No evidence of thrombus. Normal compressibility, respiratory phasicity and response to augmentation.  Saphenofemoral Junction: No evidence of thrombus. Normal compressibility and flow on color Doppler imaging.  Profunda Femoral Vein: No evidence of thrombus. Normal compressibility and flow on color Doppler imaging.  Femoral Vein: No evidence of thrombus. Normal compressibility, respiratory phasicity and response to augmentation.  Popliteal Vein: No evidence of thrombus. Normal compressibility, respiratory phasicity and response to augmentation.  Calf Veins: No evidence of thrombus. Normal compressibility and flow on color Doppler imaging.  Superficial Great Saphenous Vein: No evidence of thrombus. Normal compressibility and flow on color Doppler imaging.  Venous Reflux:  None.  Other Findings:  None.  LEFT LOWER EXTREMITY  Common Femoral Vein: No  evidence of thrombus. Normal compressibility, respiratory phasicity and response to augmentation.  Saphenofemoral Junction: No evidence of thrombus. Normal compressibility and flow on color Doppler imaging.  Profunda Femoral Vein: No evidence of thrombus. Normal compressibility and flow on color Doppler imaging.  Femoral Vein: No evidence of thrombus. Normal compressibility, respiratory phasicity and response to augmentation.  Popliteal Vein: No evidence of thrombus. Normal compressibility, respiratory phasicity and response to augmentation.  Calf Veins: Visualization was limited due to swelling. No definite abnormality seen.  Superficial Great Saphenous Vein: No evidence of thrombus. Normal compressibility and flow on color Doppler imaging.  Venous Reflux:  None.  Other Findings:  None.  IMPRESSION: No evidence of deep venous thrombosis seen in either lower extremity.   Electronically Signed   By: Marijo Conception, M.D.   On: 04/18/2015 11:48   Dg Chest Port 1 View  04/17/2015   CLINICAL DATA:  Leukocytosis.  Anemia.  Lymphoma.  EXAM: PORTABLE CHEST 1 VIEW  COMPARISON:  01/27/2015  FINDINGS: The heart size and mediastinal contours are within normal limits. No evidence of pulmonary consolidation or edema. Old left rib fracture deformities and mild left pleural thickening again noted.  IMPRESSION: No active disease.   Electronically Signed   By: Earle Gell M.D.   On: 04/17/2015 18:37     ASSESSMENT AND PLAN:   * Acute blood loss anemia over chronic anemia GI loss. Contributed by her lymphoma. And severe thrombocytopenia Transfused and Hb is better. Monitor No plans for EGD/Colonoscopy by Dr. Allen Norris as requested by patient  PPI  * ARF over CKD3 - Mild hyperkalemia Due to UTI. Improving with IVF.  * Thrombocytopenia Due to MCL Transfused  * UTI On IV abx Wait for cx results  * Mantle cell lymphoma Overall poor prognosis per Dr. Grayland Ormond. Palliative care consulted. No plans for  Chemo.  Critically ill with high risk of complications and death   All the records are reviewed and case discussed with Care Management/Social Workerr. Management plans discussed with the patient, family and they are in agreement.  CODE STATUS: FULL  DVT Prophylaxis: SCDs  TOTAL TIME TAKING CARE OF THIS PATIENT: 40 minutes.   POSSIBLE D/C IN 2-3 DAYS, DEPENDING ON CLINICAL CONDITION.   Hillary Bow R M.D on 04/18/2015 at 3:03 PM  Between 7am to 6pm - Pager - [phone]  After 6pm go to [email or web address] - password [email or web address]  Office  [phone]  CC: Primary care physician; [facility], Chrissie Noa, MD    Note: This dictation was prepared with Dragon dictation along with smaller phrase technology. Any transcriptional errors that result from this process are unintentional.

## 2015-04-18 NOTE — Progress Notes (Signed)
Call out to covering M.D. Regarding lab result f/u per Dr. Ether Griffins.  Dr. Judeen Hammans concerned with elevated K Level, K level is 5.0  at 2252. Noted ferritin level at 1654 from Lab draw at 1422.   Follow up 1 hour post blood transfusion and platelet infusion for 0145am.  Charge nurse made aware.

## 2015-04-18 NOTE — Progress Notes (Signed)
Subjective: Patient feeling better today after being transfused. Her hemoglobin has corrected appropriately. She denies any complaints of abdominal pain nausea vomiting fevers or chills. The patient has been found to have heme positive stools.   Objective: Vital signs in last 24 hours: Filed Vitals:   04/17/15 2300 04/18/15 0045 04/18/15 0101 04/18/15 0442  BP: 105/51 100/50 101/51 105/57  Pulse: 109 103 108 108  Temp: 98.3 F (36.8 C) 98.1 F (36.7 C) 97.9 F (36.6 C) 97.7 F (36.5 C)  TempSrc: Oral Oral Tympanic Oral  Resp: 18 17 18 18   Height:      Weight:      SpO2: 96% 95% 97% 95%   Weight change:   Intake/Output Summary (Last 24 hours) at 04/18/15 1225 Last data filed at 04/18/15 0915  Gross per 24 hour  Intake   2309 ml  Output      0 ml  Net   2309 ml     Exam: Regular rate and rhythm clear to auscultation soft, nontender, normal bowel sounds   Lab Results: @LABTEST2 @ Micro Results: Recent Results (from the past 240 hour(s))  Blood culture (routine x 2)     Status: None (Preliminary result)   Collection Time: 04/17/15 12:29 PM  Result Value Ref Range Status   Specimen Description BLOOD RIGHT ARM  Final   Special Requests   Final    BOTTLES DRAWN AEROBIC AND ANAEROBIC 2CC AEROBIC,2CC ANAEROBIC   Culture NO GROWTH < 24 HOURS  Final   Report Status PENDING  Incomplete  Urine culture     Status: None (Preliminary result)   Collection Time: 04/17/15 12:29 PM  Result Value Ref Range Status   Specimen Description URINE, CLEAN CATCH  Final   Special Requests Immunocompromised  Final   Culture TOO YOUNG TO READ  Final   Report Status PENDING  Incomplete  Blood culture (routine x 2)     Status: None (Preliminary result)   Collection Time: 04/17/15 12:46 PM  Result Value Ref Range Status   Specimen Description BLOOD RIGHT WRIST  Final   Special Requests   Final    BOTTLES DRAWN AEROBIC AND ANAEROBIC 2CC AEROBIC,2CCANAEROBIC   Culture NO GROWTH < 24 HOURS   Final   Report Status PENDING  Incomplete   Studies/Results: US Renal  04/18/2015   CLINICAL DATA:  Acute on chronic renal failure.  EXAM: RENAL / URINARY TRACT ULTRASOUND COMPLETE  COMPARISON:  03/24/2015  FINDINGS: Right Kidney:  Length: 10.4 cm. Focal hypoechoic mass within the midpole measures 1.8 x 1.6 x 1.8 cm. No hydronephrosis identified.  Left Kidney:  Length: 11.4 cm. Echogenicity within normal limits. No mass or hydronephrosis visualized.  Bladder:  Debris noted within the urinary bladder.  Other:  Small left pleural effusion noted.  IMPRESSION: 1. No evidence of hydronephrosis to explain patient's acute on chronic renal failure. 2. Focal hypoechoic mass within the mid right kidney corresponding with previously noted suspected lymphoma involvement.   Electronically Signed   By: Kerby Moors M.D.   On: 04/18/2015 11:45   US Venous Img Lower Bilateral  04/18/2015   CLINICAL DATA:  Bilateral lower extremity swelling.  EXAM: BILATERAL LOWER EXTREMITY VENOUS DOPPLER ULTRASOUND  TECHNIQUE: Gray-scale sonography with graded compression, as well as color Doppler and duplex ultrasound were performed to evaluate the lower extremity deep venous systems from the level of the common femoral vein and including the common femoral, femoral, profunda femoral, popliteal and calf veins including the posterior tibial, peroneal  and gastrocnemius veins when visible. The superficial great saphenous vein was also interrogated. Spectral Doppler was utilized to evaluate flow at rest and with distal augmentation maneuvers in the common femoral, femoral and popliteal veins.  COMPARISON:  None.  FINDINGS: RIGHT LOWER EXTREMITY  Common Femoral Vein: No evidence of thrombus. Normal compressibility, respiratory phasicity and response to augmentation.  Saphenofemoral Junction: No evidence of thrombus. Normal compressibility and flow on color Doppler imaging.  Profunda Femoral Vein: No evidence of thrombus. Normal  compressibility and flow on color Doppler imaging.  Femoral Vein: No evidence of thrombus. Normal compressibility, respiratory phasicity and response to augmentation.  Popliteal Vein: No evidence of thrombus. Normal compressibility, respiratory phasicity and response to augmentation.  Calf Veins: No evidence of thrombus. Normal compressibility and flow on color Doppler imaging.  Superficial Great Saphenous Vein: No evidence of thrombus. Normal compressibility and flow on color Doppler imaging.  Venous Reflux:  None.  Other Findings:  None.  LEFT LOWER EXTREMITY  Common Femoral Vein: No evidence of thrombus. Normal compressibility, respiratory phasicity and response to augmentation.  Saphenofemoral Junction: No evidence of thrombus. Normal compressibility and flow on color Doppler imaging.  Profunda Femoral Vein: No evidence of thrombus. Normal compressibility and flow on color Doppler imaging.  Femoral Vein: No evidence of thrombus. Normal compressibility, respiratory phasicity and response to augmentation.  Popliteal Vein: No evidence of thrombus. Normal compressibility, respiratory phasicity and response to augmentation.  Calf Veins: Visualization was limited due to swelling. No definite abnormality seen.  Superficial Great Saphenous Vein: No evidence of thrombus. Normal compressibility and flow on color Doppler imaging.  Venous Reflux:  None.  Other Findings:  None.  IMPRESSION: No evidence of deep venous thrombosis seen in either lower extremity.   Electronically Signed   By: Marijo Conception, M.D.   On: 04/18/2015 11:48   Dg Chest Port 1 View  04/17/2015   CLINICAL DATA:  Leukocytosis.  Anemia.  Lymphoma.  EXAM: PORTABLE CHEST 1 VIEW  COMPARISON:  01/27/2015  FINDINGS: The heart size and mediastinal contours are within normal limits. No evidence of pulmonary consolidation or edema. Old left rib fracture deformities and mild left pleural thickening again noted.  IMPRESSION: No active disease.   Electronically  Signed   By: Earle Gell M.D.   On: 04/17/2015 18:37   Medications: I have reviewed the patient's current medications. Scheduled Meds: . perphenazine  2 mg Oral BID   And  . amitriptyline  25 mg Oral BID  . cholecalciferol  1,000 Units Oral Daily  . docusate sodium  300 mg Oral BH-q7a  . glipiZIDE  2.5 mg Oral Q breakfast  . insulin aspart  0-5 Units Subcutaneous QHS  . insulin aspart  0-9 Units Subcutaneous TID WC  . insulin aspart  3 Units Subcutaneous TID WC  . loratadine  10 mg Oral Daily  . pantoprazole (PROTONIX) IV  40 mg Intravenous Q12H  . pravastatin  80 mg Oral q1800   Continuous Infusions: . sodium chloride 150 mL/hr at 04/18/15 0925   PRN Meds:.acetaminophen **OR** acetaminophen, ondansetron **OR** ondansetron (ZOFRAN) IV, traMADol-acetaminophen Assessment: Active Problems:   Anemia   Blood in stool    Plan: This patient has anemia with mantle cell lymphoma and has increased her hemoglobin with transfusion. The patient would like to hold off on any GI workup at the present time due to her feeling weak. She will also be followed for her hemoglobin and hematocrit and may need it sooner than later if  she develops worsening anemia.   LOS: 1 day   Daren Wohl 04/18/2015, 12:25 PM

## 2015-04-18 NOTE — Clinical Documentation Improvement (Signed)
Internal Medicine Oncology  Can the diagnosis of "Acute on chronic renal failure"  be further specified?  Acute kidney injury on chronic kidney disease / chronic renal failure   Other condition  Unable to determine at present  Please also specify the stage of CKD if known:   CKD Stage I - GFR greater than or equal to 90  CKD Stage II - GFR 60-89  CKD Stage III - GFR 30-59  CKD Stage IV - GFR 15-29  Other condition  Unable to clinically determine   Supporting Information:   Component      BUN Creatinine  Latest Ref Rng      6 - 20 mg/dL 0.44 - 1.00 mg/dL  04/17/2015     9:57 AM 63 (H) 2.29 (H)  04/17/2015     10:52 PM 58 (H) 2.23 (H)  04/18/2015      57 (H) 2.16 (H)   Component      EGFR (Non-African Amer.)  Latest Ref Rng      >60 mL/min  04/17/2015     9:57 AM 19 (L)  04/17/2015     10:52 PM 20 (L)  04/18/2015      21 (L)   Please exercise your independent, professional judgment when responding. A specific answer is not anticipated or expected.  Thank you, Mateo Flow, RN [phone] Clinical Documentation Specialist

## 2015-04-18 NOTE — Clinical Social Work Note (Signed)
Clinical Social Work Assessment  Patient Details  Name: Anna Leonard MRN: [id number] Date of Birth: 06-24-1936  Date of referral:  04/17/15               Reason for consult:  Facility Placement                Permission sought to share information with:  Family Supports Permission granted to share information::     Name::        Agency::     Relationship::     Contact Information:     Housing/Transportation Living arrangements for the past 2 months:  [place] of Information:  Adult Children Patient Interpreter Needed:  None Criminal Activity/Legal Involvement Pertinent to Current Situation/Hospitalization:  No - Comment as needed Significant Relationships:  None Lives with:  Facility Resident Do you feel safe going back to the place where you live?  Yes Need for family participation in patient care:  Yes (Comment)  Care giving concerns:  None noted   Facilities manager / plan:  Patient off floor at time of visit- spoke with family members at bedside who report she has been  At [facility] fo about 22 days. Plan is for her to return there at dc- pending insurance re-auth,.   Employment status:  Retired Forensic scientist:    PT Recommendations:  [place] / Referral to community resources:     Patient/Family's Response to care:  Supportive family   Patient/Family's Understanding of and Emotional Response to Diagnosis, Current Treatment, and Prognosis:  Family involved and agrees to treatment plans as well as dc back to SNF  Emotional Assessment Appearance:  Other (Comment Required (off the floor at time of visit) Attitude/Demeanor/Rapport:  Unable to Assess Affect (typically observed):  Unable to Assess Orientation:    Alcohol / Substance use:  Not Applicable Psych involvement (Current and /or in the community):  No (Comment)  Discharge Needs  Concerns to be addressed:    Readmission within the last  30 days:    Current discharge risk:  None Barriers to Discharge:  No Barriers Identified   Ludwig Clarks, LCSW 04/18/2015, 2:26 PM

## 2015-04-19 DIAGNOSIS — C8310 Mantle cell lymphoma, unspecified site: Secondary | ICD-10-CM

## 2015-04-19 DIAGNOSIS — D696 Thrombocytopenia, unspecified: Secondary | ICD-10-CM

## 2015-04-19 DIAGNOSIS — Z9221 Personal history of antineoplastic chemotherapy: Secondary | ICD-10-CM

## 2015-04-19 DIAGNOSIS — D649 Anemia, unspecified: Secondary | ICD-10-CM

## 2015-04-19 DIAGNOSIS — Z79899 Other long term (current) drug therapy: Secondary | ICD-10-CM

## 2015-04-19 LAB — CBC WITH DIFFERENTIAL/PLATELET
BAND NEUTROPHILS: 0 %
BASOS ABS: 0 10*3/uL (ref 0–0.1)
BASOS PCT: 0 %
BLASTS: 0 %
EOS ABS: 0.3 10*3/uL (ref 0–0.7)
Eosinophils Relative: 1 %
HCT: 22.4 % — ABNORMAL LOW (ref 35.0–47.0)
Hemoglobin: 7.7 g/dL — ABNORMAL LOW (ref 12.0–16.0)
Lymphocytes Relative: 38 %
Lymphs Abs: 11.9 10*3/uL — ABNORMAL HIGH (ref 1.0–3.6)
MCH: 28.2 pg (ref 26.0–34.0)
MCHC: 34.6 g/dL (ref 32.0–36.0)
MCV: 81.7 fL (ref 80.0–100.0)
METAMYELOCYTES PCT: 0 %
MONO ABS: 1.6 10*3/uL — AB (ref 0.2–0.9)
MONOS PCT: 5 %
Myelocytes: 0 %
NEUTROS ABS: 2.2 10*3/uL (ref 1.4–6.5)
Neutrophils Relative %: 7 %
Other: 49 %
PLATELETS: 26 10*3/uL — AB (ref 150–440)
PROMYELOCYTES ABS: 0 %
RBC: 2.74 MIL/uL — ABNORMAL LOW (ref 3.80–5.20)
RDW: 15.3 % — AB (ref 11.5–14.5)
WBC: 31.3 10*3/uL — ABNORMAL HIGH (ref 3.6–11.0)
nRBC: 3 /100 WBC — ABNORMAL HIGH

## 2015-04-19 LAB — BASIC METABOLIC PANEL
Anion gap: 10 (ref 5–15)
BUN: 42 mg/dL — AB (ref 6–20)
CALCIUM: 8.8 mg/dL — AB (ref 8.9–10.3)
CO2: 21 mmol/L — ABNORMAL LOW (ref 22–32)
CREATININE: 1.62 mg/dL — AB (ref 0.44–1.00)
Chloride: 105 mmol/L (ref 101–111)
GFR calc Af Amer: 34 mL/min — ABNORMAL LOW (ref >60)
GFR, EST NON AFRICAN AMERICAN: 30 mL/min — AB (ref >60)
GLUCOSE: 66 mg/dL (ref 65–99)
POTASSIUM: 4.1 mmol/L (ref 3.5–5.1)
SODIUM: 136 mmol/L (ref 135–145)

## 2015-04-19 LAB — URINE CULTURE

## 2015-04-19 LAB — GLUCOSE, CAPILLARY
GLUCOSE-CAPILLARY: 56 mg/dL — AB (ref 65–99)
GLUCOSE-CAPILLARY: 89 mg/dL (ref 65–99)
GLUCOSE-CAPILLARY: 75 mg/dL (ref 65–99)
Glucose-Capillary: 93 mg/dL (ref 65–99)
Glucose-Capillary: 172 mg/dL — ABNORMAL HIGH (ref 65–99)
Glucose-Capillary: 86 mg/dL (ref 65–99)

## 2015-04-19 MED ORDER — DEXTROSE 5 % IV SOLN
1.0000 g | INTRAVENOUS | Status: DC
  Administered 2015-04-19: 1 g via INTRAVENOUS
  Filled 2015-04-19 (×3): qty 10

## 2015-04-19 MED ORDER — BISACODYL 10 MG RE SUPP: 10.0000 mg | RECTAL | Status: AC | PRN

## 2015-04-19 MED ORDER — MORPHINE SULFATE (PF) 2 MG/ML IV SOLN: 2.0000 mg | INTRAVENOUS | Status: DC | PRN

## 2015-04-19 MED ORDER — ACETAMINOPHEN 325 MG PO TABS: 650.0000 mg | ORAL_TABLET | ORAL | Status: AC | PRN

## 2015-04-19 MED ORDER — GLYCOPYRROLATE 1 MG PO TABS: 1.0000 mg | ORAL_TABLET | Freq: Three times a day (TID) | ORAL | Status: AC | PRN

## 2015-04-19 MED ORDER — PROCHLORPERAZINE 25 MG RE SUPP: 25.0000 mg | Freq: Two times a day (BID) | RECTAL | Status: AC | PRN

## 2015-04-19 MED ORDER — MORPHINE SULFATE (CONCENTRATE) 10 MG /0.5 ML PO SOLN: 5.0000 mg | ORAL | Status: AC | PRN

## 2015-04-19 MED ORDER — METOPROLOL SUCCINATE ER 25 MG PO TB24
25.0000 mg | ORAL_TABLET | Freq: Every day | ORAL | Status: DC
  Administered 2015-04-19: 25 mg via ORAL
  Filled 2015-04-19: qty 1

## 2015-04-19 MED ORDER — LORAZEPAM 0.5 MG PO TABS: 0.5000 mg | ORAL_TABLET | ORAL | Status: AC | PRN

## 2015-04-19 NOTE — Plan of Care (Signed)
Problem: Discharge Progression Outcomes Goal: Pain controlled with appropriate interventions Outcome: Not Met (add Reason) Pt refused pain medication Goal: Other Discharge Outcomes/Goals Outcome: Progressing Plan of Care Progress to Goal:   Pt has been resting throughout the shift. Pt report she was not able to void, bladder scan revealed >956m, MD paged and spoke with Dr. RReece Levy In and out cath once ordered and performed. Pt had 10071moutput. No other signs of distress noted. Will continue to monitor.

## 2015-04-19 NOTE — Clinical Social Work Note (Signed)
MD stated that patient not ready to discharge today nor anticipated over the weekend. [facility] notified. Medicare [employer] rep: Amy was notified today to begin looking at for [place] for going to [facility]. Shela Leff MSW,LCSW [phone]

## 2015-04-19 NOTE — Consult Note (Signed)
[place]  [phone] [phone]  ID: Anna Leonard OB: 11/30/1935  MR#: [id number]  JOI#:[id number]  Patient Care Team: Sofie Hartigan, MD as PCP - General (Family Medicine)  CHIEF COMPLAINT:  Chief Complaint  Patient presents with  . Anemia    INTERVAL HISTORY: Patient is a 78 year old female with a known history of mantle cell lymphoma who last had chemotherapy in March of 2016.  She was recently admitted to the hospital and found to have significant progression of disease on CT scan.  Patient now back in her room and after lengthy discussion she does not desire any additional treatment for her mantle cell lymphoma. She has agreed to be DO NOT RESUSCITATE and is a candidate for transfer to hospice home.   REVIEW OF SYSTEMS:   Review of Systems  Constitutional: Positive for malaise/fatigue.  Respiratory: Negative.   Cardiovascular: Negative.   Gastrointestinal: Positive for blood in stool.  Genitourinary:       Urinary retention.  Musculoskeletal: Negative.   Neurological: Positive for weakness.    As per HPI. Otherwise, a complete review of systems is negatve.  PAST MEDICAL HISTORY: Past Medical History  Diagnosis Date  . Hypertension   . Allergy   . Type 2 diabetes mellitus   . HLD (hyperlipidemia)   . Anxiety   . Paralysis     left arm  . Cancer     lymphoma- chemo  . Bell palsy   . Mantle cell lymphoma     PAST SURGICAL HISTORY: Past Surgical History  Procedure Laterality Date  . Vesicovaginal fistula closure w/ tah    . Appendectomy    . Abdominal hysterectomy      FAMILY HISTORY Family History  Problem Relation Age of Onset  . Hypertension Mother   . Cancer Mother 89    Breast  . Cancer Father     Testicular  . Diabetes Sister   . Diabetes Brother        ADVANCED DIRECTIVES:    HEALTH MAINTENANCE: Social History  Substance Use Topics  . Smoking status: Never Smoker   . Smokeless  tobacco: Never Used  . Alcohol Use: No     Colonoscopy:  PAP:  Bone density:  Lipid panel:  Allergies  Allergen Reactions  . Amoxicillin-Pot Clavulanate Other (See Comments)    diarrhea    Current Facility-Administered Medications  Medication Dose Route Frequency Provider Last Rate Last Dose  . 0.9 %  sodium chloride infusion   Intravenous Continuous Hillary Bow, MD 50 mL/hr at 04/19/15 0930    . acetaminophen (TYLENOL) tablet 650 mg  650 mg Oral Q6H PRN Theodoro Grist, MD       Or  . acetaminophen (TYLENOL) suppository 650 mg  650 mg Rectal Q6H PRN Theodoro Grist, MD      . perphenazine (TRILAFON) tablet 2 mg  2 mg Oral BID Theodoro Grist, MD   2 mg at 04/19/15 1057   And  . amitriptyline (ELAVIL) tablet 25 mg  25 mg Oral BID Theodoro Grist, MD   25 mg at 04/19/15 1018  . cefTRIAXone (ROCEPHIN) 1 g in dextrose 5 % 50 mL IVPB  1 g Intravenous Q24H Srikar Sudini, MD   1 g at 04/19/15 1056  . cholecalciferol (VITAMIN D) tablet 1,000 Units  1,000 Units Oral Daily Theodoro Grist, MD   1,000 Units at 04/19/15 1018  . docusate sodium (COLACE) capsule 300 mg  300 mg Oral BH-q7a Theodoro Grist, MD  300 mg at 04/19/15 0616  . insulin aspart (novoLOG) injection 0-5 Units  0-5 Units Subcutaneous QHS Theodoro Grist, MD   0 Units at 04/17/15 2204  . insulin aspart (novoLOG) injection 0-9 Units  0-9 Units Subcutaneous TID WC Theodoro Grist, MD   1 Units at 04/17/15 1701  . loratadine (CLARITIN) tablet 10 mg  10 mg Oral Daily Theodoro Grist, MD   10 mg at 04/19/15 1018  . metoprolol succinate (TOPROL-XL) 24 hr tablet 25 mg  25 mg Oral Daily Hillary Bow, MD   25 mg at 04/19/15 1018  . ondansetron (ZOFRAN) tablet 4 mg  4 mg Oral Q6H PRN Theodoro Grist, MD       Or  . ondansetron (ZOFRAN) injection 4 mg  4 mg Intravenous Q6H PRN Theodoro Grist, MD      . pantoprazole (PROTONIX) injection 40 mg  40 mg Intravenous Q12H Theodoro Grist, MD   40 mg at 04/19/15 1041  . pravastatin (PRAVACHOL) tablet 80 mg  80 mg  Oral q1800 Theodoro Grist, MD   80 mg at 04/18/15 1730  . traMADol-acetaminophen (ULTRACET) 37.5-325 MG per tablet 1 tablet  1 tablet Oral Q4H PRN Theodoro Grist, MD   1 tablet at 04/19/15 0908    OBJECTIVE: Filed Vitals:   04/19/15 1341  BP: 108/42  Pulse: 111  Temp: 97.8 F (36.6 C)  Resp: 18     Body mass index is 26.51 kg/(m^2).    ECOG FS:4 - Bedbound  General: Ill-appearing, no acute distress. Eyes: Pink conjunctiva, anicteric sclera. HEENT: Normocephalic, moist mucous membranes, clear oropharnyx. Lungs: Clear to auscultation bilaterally. Heart: Regular rate and rhythm. No rubs, murmurs, or gallops. Abdomen: Soft, nontender, nondistended. No organomegaly noted, normoactive bowel sounds. Musculoskeletal: No edema, cyanosis, or clubbing. Neuro: Alert, answering all questions appropriately. Cranial nerves grossly intact. Skin: No rashes or petechiae noted. Psych: Normal affect. Lymphatics: No cervical, calvicular, axillary or inguinal LAD.  LAB RESULTS:  Lab Results  Component Value Date   NA 136 04/19/2015   K 4.1 04/19/2015   CL 105 04/19/2015   CO2 21* 04/19/2015   GLUCOSE 66 04/19/2015   BUN 42* 04/19/2015   CREATININE 1.62* 04/19/2015   CALCIUM 8.8* 04/19/2015   PROT 6.0* 04/17/2015   ALBUMIN 2.5* 04/17/2015   AST 25 04/17/2015   ALT 16 04/17/2015   ALKPHOS 225* 04/17/2015   BILITOT 0.5 04/17/2015   GFRNONAA 30* 04/19/2015   GFRAA 34* 04/19/2015    Lab Results  Component Value Date   WBC 31.3* 04/19/2015   NEUTROABS 2.2 04/19/2015   HGB 7.7* 04/19/2015   HCT 22.4* 04/19/2015   MCV 81.7 04/19/2015   PLT 26* 04/19/2015     STUDIES: Ct Head Wo Contrast  03/24/2015   CLINICAL DATA:  Weakness, fever, burning with urination for 2-3 weeks.  EXAM: CT HEAD WITHOUT CONTRAST  TECHNIQUE: Contiguous axial images were obtained from the base of the skull through the vertex without intravenous contrast.  COMPARISON:  01/27/2015  FINDINGS: There is no evidence of mass  effect, midline shift, or extra-axial fluid collections. There is no evidence of a space-occupying lesion or intracranial hemorrhage. There is no evidence of a cortical-based area of acute infarction. There is an old right frontoparietal encephalomalacia. There is periventricular white matter low attenuation likely secondary to microangiopathy.  The ventricles and sulci are appropriate for the patient's age. The basal cisterns are patent.  Visualized portions of the orbits are unremarkable. The visualized portions of the paranasal sinuses  and mastoid air cells are unremarkable. Cerebrovascular atherosclerotic calcifications are noted.  There is evidence of prior right frontoparietal craniectomy. There is no acute osseous abnormality.  IMPRESSION: No acute intracranial pathology.  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   Electronically Signed   By: Kathreen Devoid   On: 03/24/2015 14:10   Ct Abdomen Pelvis W Contrast  03/24/2015   CLINICAL DATA:  Fever, weakness, burning with urination for 2-3 weeks. History of lymphoma.  EXAM: CT ABDOMEN AND PELVIS WITH CONTRAST  TECHNIQUE: Multidetector CT imaging of the abdomen and pelvis was performed using the standard protocol following bolus administration of intravenous contrast.  CONTRAST:  123mL OMNIPAQUE IOHEXOL 300 MG/ML  SOLN  COMPARISON:  PET-CT 10/15/2014  FINDINGS: Lower chest: Mild chronic interstitial disease at the left lung base. No pleural effusion. Normal heart size.  Hepatobiliary: Normal liver. Gallbladder filled with cholelithiasis. Gallbladder is otherwise normal.  Pancreas: Normal pancreas.  Spleen: Normal spleen.  Adrenals/Urinary Tract: Normal adrenal glands. No obstructive uropathy. No nephrolithiasis. There are numerous hypodense masses throughout bilateral kidneys with the largest in the right kidney measuring 2.4 cm and the largest in the left kidney measuring 3.6 cm. There is circumferential bladder wall thickening.  Stomach/Bowel: No bowel wall  thickening or dilatation. No bowel obstruction. No pneumatosis, pneumoperitoneum or portal venous gas. No abdominal pelvic free fluid. There is a small fat containing umbilical hernia.  Vascular/Lymphatic: Normal caliber abdominal aorta. Large retroperitoneal soft tissue mass measuring approximately 9.1 x 6.7 x 11.2 cm encasing the distal portion of the abdominal aorta and common iliac arteries as well as the IVC. There is a large presacral soft tissue mass measuring 8.3 x 5.3 cm encasing the right S3 nerve root. There is a left cardiophrenic mass measuring 1.3 x 3.4 cm most consistent with an enlarged lymph node.  Reproductive: Prior hysterectomy.  No adnexal mass.  Other: There is a 7 x 3.9 cm soft tissue mass in the subcutaneous fat of the left lower back.  Musculoskeletal: There is expansion of the left iliacus muscle, left gluteus maximus muscle and left gluteus medius muscle. There is no aggressive lytic or sclerotic osseous lesion. There is an ununited insufficiency fracture of the right inferior pubic ramus and right pubic symphysis. There is increased soft tissue density within the right L5-S1 neural foramen and extending into the epidural space.  IMPRESSION: 1. The overall appearance of the abdomen/pelvis is most consistent with severe recurrence of lymphoma as described above. 2. There are multiple hypodense masses throughout bilateral kidneys most concerning for lymphomatous involvement. 3. There is increased soft tissue density within the right L5-S1 neural foramen and extending into the epidural space likely reflecting lymphoma recurrence. 4. Cholelithiasis.   Electronically Signed   By: Kathreen Devoid   On: 03/24/2015 14:35   US Renal  04/18/2015   CLINICAL DATA:  Acute on chronic renal failure.  EXAM: RENAL / URINARY TRACT ULTRASOUND COMPLETE  COMPARISON:  03/24/2015  FINDINGS: Right Kidney:  Length: 10.4 cm. Focal hypoechoic mass within the midpole measures 1.8 x 1.6 x 1.8 cm. No hydronephrosis  identified.  Left Kidney:  Length: 11.4 cm. Echogenicity within normal limits. No mass or hydronephrosis visualized.  Bladder:  Debris noted within the urinary bladder.  Other:  Small left pleural effusion noted.  IMPRESSION: 1. No evidence of hydronephrosis to explain patient's acute on chronic renal failure. 2. Focal hypoechoic mass within the mid right kidney corresponding with previously noted suspected lymphoma involvement.   Electronically Signed   By:  Kerby Moors M.D.   On: 04/18/2015 11:45   US Venous Img Lower Bilateral  04/18/2015   CLINICAL DATA:  Bilateral lower extremity swelling.  EXAM: BILATERAL LOWER EXTREMITY VENOUS DOPPLER ULTRASOUND  TECHNIQUE: Gray-scale sonography with graded compression, as well as color Doppler and duplex ultrasound were performed to evaluate the lower extremity deep venous systems from the level of the common femoral vein and including the common femoral, femoral, profunda femoral, popliteal and calf veins including the posterior tibial, peroneal and gastrocnemius veins when visible. The superficial great saphenous vein was also interrogated. Spectral Doppler was utilized to evaluate flow at rest and with distal augmentation maneuvers in the common femoral, femoral and popliteal veins.  COMPARISON:  None.  FINDINGS: RIGHT LOWER EXTREMITY  Common Femoral Vein: No evidence of thrombus. Normal compressibility, respiratory phasicity and response to augmentation.  Saphenofemoral Junction: No evidence of thrombus. Normal compressibility and flow on color Doppler imaging.  Profunda Femoral Vein: No evidence of thrombus. Normal compressibility and flow on color Doppler imaging.  Femoral Vein: No evidence of thrombus. Normal compressibility, respiratory phasicity and response to augmentation.  Popliteal Vein: No evidence of thrombus. Normal compressibility, respiratory phasicity and response to augmentation.  Calf Veins: No evidence of thrombus. Normal compressibility and flow  on color Doppler imaging.  Superficial Great Saphenous Vein: No evidence of thrombus. Normal compressibility and flow on color Doppler imaging.  Venous Reflux:  None.  Other Findings:  None.  LEFT LOWER EXTREMITY  Common Femoral Vein: No evidence of thrombus. Normal compressibility, respiratory phasicity and response to augmentation.  Saphenofemoral Junction: No evidence of thrombus. Normal compressibility and flow on color Doppler imaging.  Profunda Femoral Vein: No evidence of thrombus. Normal compressibility and flow on color Doppler imaging.  Femoral Vein: No evidence of thrombus. Normal compressibility, respiratory phasicity and response to augmentation.  Popliteal Vein: No evidence of thrombus. Normal compressibility, respiratory phasicity and response to augmentation.  Calf Veins: Visualization was limited due to swelling. No definite abnormality seen.  Superficial Great Saphenous Vein: No evidence of thrombus. Normal compressibility and flow on color Doppler imaging.  Venous Reflux:  None.  Other Findings:  None.  IMPRESSION: No evidence of deep venous thrombosis seen in either lower extremity.   Electronically Signed   By: Marijo Conception, M.D.   On: 04/18/2015 11:48   Dg Chest Port 1 View  04/17/2015   CLINICAL DATA:  Leukocytosis.  Anemia.  Lymphoma.  EXAM: PORTABLE CHEST 1 VIEW  COMPARISON:  01/27/2015  FINDINGS: The heart size and mediastinal contours are within normal limits. No evidence of pulmonary consolidation or edema. Old left rib fracture deformities and mild left pleural thickening again noted.  IMPRESSION: No active disease.   Electronically Signed   By: Earle Gell M.D.   On: 04/17/2015 18:37    ASSESSMENT: Likely recurrent Mantle Cell Lymphoma  PLAN:    1. Mantle cell lymphoma: CT scan results reviewed independently and highly suspicious for recurrent mantle cell. Patient last received chemotherapy with Rituxan and Treanda in February 2016. Her pancytopenia is also likely  progression of disease.  After lengthy discussion with the patient, she is agreed to be DO NOT RESUSCITATE and also does not wish any further treatment. Have recommended hospice and patient is a candidate for the hospice home given her decreased performance status. No further interventions are needed.  2.  Anemia:  Likely progression of disease.  3.  Thrombocytopenia:  Likely progression of disease.   4.  Disposition: Possible  transfer to hospice home tomorrow.  DO NOT RESUSCITATE/DO NOT INTUBATE.  Lloyd Huger, MD   04/19/2015 4:02 PM

## 2015-04-19 NOTE — Care Management Important Message (Signed)
Important Message  Patient Details  Name: Anna Leonard MRN: [id number] Date of Birth: 1935/11/29   Medicare Important Message Given:  Yes-second notification given    [name] 04/19/2015, 11:14 AM

## 2015-04-19 NOTE — Progress Notes (Signed)
[facility] [place] NAME: Anna Leonard    MR#:  [id number]  DATE OF BIRTH:  [birth year]  SUBJECTIVE:  CHIEF COMPLAINT:   Chief Complaint  Patient presents with  . Anemia   Feels close to normal. Some fatigue. Waiting for prognosis about her lymphoma. Pain right thigh on and off chronic. Has chronic edema. Had urinary retention overnight and in and out cath with 1000 ml out. REVIEW OF SYSTEMS:    Review of Systems  Constitutional: Positive for malaise/fatigue. Negative for fever and chills.  HENT: Negative for sore throat.   Eyes: Negative for blurred vision, double vision and pain.  Respiratory: Negative for cough, hemoptysis, shortness of breath and wheezing.   Cardiovascular: Positive for leg swelling. Negative for chest pain, palpitations and orthopnea.  Gastrointestinal: Negative for heartburn, nausea, vomiting, abdominal pain, diarrhea and constipation.  Genitourinary: Negative for dysuria and hematuria.  Musculoskeletal: Negative for back pain and joint pain.  Skin: Negative for rash.  Neurological: Positive for weakness. Negative for sensory change, speech change, focal weakness and headaches.  Endo/Heme/Allergies: Does not bruise/bleed easily.  Psychiatric/Behavioral: Negative for depression. The patient is not nervous/anxious.       DRUG ALLERGIES:   Allergies  Allergen Reactions  . Amoxicillin-Pot Clavulanate Other (See Comments)    diarrhea    VITALS:  Blood pressure 144/65, pulse 117, temperature 97.9 F (36.6 C), temperature source Oral, resp. rate 18, height 5\' 2"  (1.575 m), weight 65.772 kg (145 lb), SpO2 97 %.  PHYSICAL EXAMINATION:   Physical Exam  GENERAL:  79 y.o.-year-old patient lying in the bed with no acute distress.  EYES: Pupils equal, round, reactive to light and accommodation. No scleral icterus. Extraocular muscles intact.  HEENT: Head atraumatic, normocephalic. Oropharynx and  nasopharynx clear.  NECK:  Supple, no jugular venous distention. No thyroid enlargement, no tenderness.  LUNGS: Normal breath sounds bilaterally, no wheezing, rales, rhonchi. No use of accessory muscles of respiration.  CARDIOVASCULAR: S1, S2 normal. No murmurs, rubs, or gallops.  ABDOMEN: Soft, nontender, nondistended. Bowel sounds present. No organomegaly or mass.  EXTREMITIES: No cyanosis, clubbing. 3+ edema   NEUROLOGIC: Cranial nerves II through XII are intact. No focal Motor or sensory deficits b/l.   PSYCHIATRIC: The patient is alert and awake SKIN: No obvious rash, lesion, or ulcer.    LABORATORY PANEL:   CBC  Recent Labs Lab 04/19/15 0530  WBC 31.3*  HGB 7.7*  HCT 22.4*  PLT 26*   ------------------------------------------------------------------------------------------------------------------  Chemistries   Recent Labs Lab 04/17/15 0957  04/19/15 0530  NA 130*  < > 136  K 5.9*  < > 4.1  CL 96*  < > 105  CO2 25  < > 21*  GLUCOSE 169*  < > 66  BUN 63*  < > 42*  CREATININE 2.29*  < > 1.62*  CALCIUM 9.3  < > 8.8*  AST 25  --   --   ALT 16  --   --   ALKPHOS 225*  --   --   BILITOT 0.5  --   --   < > = values in this interval not displayed. ------------------------------------------------------------------------------------------------------------------  Cardiac Enzymes  Recent Labs Lab 04/17/15 0957  TROPONINI <0.03   ------------------------------------------------------------------------------------------------------------------  RADIOLOGY:  US Renal  04/18/2015   CLINICAL DATA:  Acute on chronic renal failure.  EXAM: RENAL / URINARY TRACT ULTRASOUND COMPLETE  COMPARISON:  03/24/2015  FINDINGS: Right Kidney:  Length: 10.4 cm. Focal  hypoechoic mass within the midpole measures 1.8 x 1.6 x 1.8 cm. No hydronephrosis identified.  Left Kidney:  Length: 11.4 cm. Echogenicity within normal limits. No mass or hydronephrosis visualized.  Bladder:  Debris noted  within the urinary bladder.  Other:  Small left pleural effusion noted.  IMPRESSION: 1. No evidence of hydronephrosis to explain patient's acute on chronic renal failure. 2. Focal hypoechoic mass within the mid right kidney corresponding with previously noted suspected lymphoma involvement.   Electronically Signed   By: Kerby Moors M.D.   On: 04/18/2015 11:45   US Venous Img Lower Bilateral  04/18/2015   CLINICAL DATA:  Bilateral lower extremity swelling.  EXAM: BILATERAL LOWER EXTREMITY VENOUS DOPPLER ULTRASOUND  TECHNIQUE: Gray-scale sonography with graded compression, as well as color Doppler and duplex ultrasound were performed to evaluate the lower extremity deep venous systems from the level of the common femoral vein and including the common femoral, femoral, profunda femoral, popliteal and calf veins including the posterior tibial, peroneal and gastrocnemius veins when visible. The superficial great saphenous vein was also interrogated. Spectral Doppler was utilized to evaluate flow at rest and with distal augmentation maneuvers in the common femoral, femoral and popliteal veins.  COMPARISON:  None.  FINDINGS: RIGHT LOWER EXTREMITY  Common Femoral Vein: No evidence of thrombus. Normal compressibility, respiratory phasicity and response to augmentation.  Saphenofemoral Junction: No evidence of thrombus. Normal compressibility and flow on color Doppler imaging.  Profunda Femoral Vein: No evidence of thrombus. Normal compressibility and flow on color Doppler imaging.  Femoral Vein: No evidence of thrombus. Normal compressibility, respiratory phasicity and response to augmentation.  Popliteal Vein: No evidence of thrombus. Normal compressibility, respiratory phasicity and response to augmentation.  Calf Veins: No evidence of thrombus. Normal compressibility and flow on color Doppler imaging.  Superficial Great Saphenous Vein: No evidence of thrombus. Normal compressibility and flow on color Doppler  imaging.  Venous Reflux:  None.  Other Findings:  None.  LEFT LOWER EXTREMITY  Common Femoral Vein: No evidence of thrombus. Normal compressibility, respiratory phasicity and response to augmentation.  Saphenofemoral Junction: No evidence of thrombus. Normal compressibility and flow on color Doppler imaging.  Profunda Femoral Vein: No evidence of thrombus. Normal compressibility and flow on color Doppler imaging.  Femoral Vein: No evidence of thrombus. Normal compressibility, respiratory phasicity and response to augmentation.  Popliteal Vein: No evidence of thrombus. Normal compressibility, respiratory phasicity and response to augmentation.  Calf Veins: Visualization was limited due to swelling. No definite abnormality seen.  Superficial Great Saphenous Vein: No evidence of thrombus. Normal compressibility and flow on color Doppler imaging.  Venous Reflux:  None.  Other Findings:  None.  IMPRESSION: No evidence of deep venous thrombosis seen in either lower extremity.   Electronically Signed   By: Marijo Conception, M.D.   On: 04/18/2015 11:48   Dg Chest Port 1 View  04/17/2015   CLINICAL DATA:  Leukocytosis.  Anemia.  Lymphoma.  EXAM: PORTABLE CHEST 1 VIEW  COMPARISON:  01/27/2015  FINDINGS: The heart size and mediastinal contours are within normal limits. No evidence of pulmonary consolidation or edema. Old left rib fracture deformities and mild left pleural thickening again noted.  IMPRESSION: No active disease.   Electronically Signed   By: Earle Gell M.D.   On: 04/17/2015 18:37     ASSESSMENT AND PLAN:   * Acute blood loss anemia over chronic anemia GI loss. Contributed by her lymphoma. And severe thrombocytopenia Transfused and Hb is better. Monitor  No plans for EGD/Colonoscopy by Dr. Allen Norris as per patient's preference PPI  * ARF over CKD3 - Mild hyperkalemia Due to UTI. Improving with IVF.  * Thrombocytopenia Due to MCL Transfused  * UTI On IV abx - ceftriaxone Wait for cx results  *  Mantle cell lymphoma Overall poor prognosis per Dr. Grayland Ormond. Palliative care consulted. No plans for Chemo.  * urinary retention likely from UTI Foley if any further issues.  * LE pain. No DVT.  Critically ill with high risk of complications and death  Discussed with patient and daughter. Confirmed she is a DNR. Changed code status.  All the records are reviewed and case discussed with Care Management/Social Workerr. Management plans discussed with the patient, family and they are in agreement.  CODE STATUS: DNR  DVT Prophylaxis: Thrombocytopenia. No heparin/Lovenox.  TOTAL TIME TAKING CARE OF THIS PATIENT: 40 minutes.    Hillary Bow R M.D on 04/19/2015 at 10:37 AM  Between 7am to 6pm - Pager - [phone]  After 6pm go to [email or web address] - password [email or web address] [facility]  Office  [phone]  CC: Primary care physician; [facility], Chrissie Noa, MD    Note: This dictation was prepared with Dragon dictation along with smaller phrase technology. Any transcriptional errors that result from this process are unintentional.

## 2015-04-19 NOTE — Plan of Care (Signed)
Pt has been much more lethargic today than she was on Wednesday. C/o leg pain - would be resolved w/Tramadol.  Pt made DNR.  Dr. Grayland Ormond explained that her mantle cell lymphoma has come back very aggressively.  She will be going to [facility] tomorrow.  She's also been retaining urine - bladder scanned her and she had 613 ml.  Put in a foley.  Family wants her diet advanced so she can have a chicken sandwich.  Explained that w/her lethargy - she is at risk for aspiration, but we'll ask rounding dr tomorrow.

## 2015-04-19 NOTE — Progress Notes (Signed)
Subjective: The patient has a lower Hb. Today. Denies any complaints.   Objective: Vital signs in last 24 hours: Filed Vitals:   04/18/15 2104 04/19/15 0045 04/19/15 0444 04/19/15 0830  BP: 116/58  111/52 144/65  Pulse: 166 118 110 117  Temp: 98.9 F (37.2 C)  97.4 F (36.3 C) 97.9 F (36.6 C)  TempSrc: Axillary  Oral Oral  Resp: 18  18 18   Height:      Weight:      SpO2: 96% 96% 98% 97%   Weight change:   Intake/Output Summary (Last 24 hours) at 04/19/15 1202 Last data filed at 04/19/15 4193  Gross per 24 hour  Intake 3852.5 ml  Output   1150 ml  Net 2702.5 ml     Exam: Regular rate and rhythm clear to auscultation soft, nontender, normal bowel sounds   Lab Results: @LABTEST2 @ Micro Results: Recent Results (from the past 240 hour(s))  Blood culture (routine x 2)     Status: None (Preliminary result)   Collection Time: 04/17/15 12:29 PM  Result Value Ref Range Status   Specimen Description BLOOD RIGHT ARM  Final   Special Requests   Final    BOTTLES DRAWN AEROBIC AND ANAEROBIC 2CC AEROBIC,2CC ANAEROBIC   Culture NO GROWTH < 24 HOURS  Final   Report Status PENDING  Incomplete  Urine culture     Status: None   Collection Time: 04/17/15 12:29 PM  Result Value Ref Range Status   Specimen Description URINE, CLEAN CATCH  Final   Special Requests Immunocompromised  Final   Culture MULTIPLE SPECIES PRESENT, SUGGEST RECOLLECTION  Final   Report Status 04/19/2015 FINAL  Final  Blood culture (routine x 2)     Status: None (Preliminary result)   Collection Time: 04/17/15 12:46 PM  Result Value Ref Range Status   Specimen Description BLOOD RIGHT WRIST  Final   Special Requests   Final    BOTTLES DRAWN AEROBIC AND ANAEROBIC 2CC AEROBIC,2CCANAEROBIC   Culture NO GROWTH < 24 HOURS  Final   Report Status PENDING  Incomplete   Studies/Results: US Renal  04/18/2015   CLINICAL DATA:  Acute on chronic renal failure.  EXAM: RENAL / URINARY TRACT ULTRASOUND COMPLETE   COMPARISON:  03/24/2015  FINDINGS: Right Kidney:  Length: 10.4 cm. Focal hypoechoic mass within the midpole measures 1.8 x 1.6 x 1.8 cm. No hydronephrosis identified.  Left Kidney:  Length: 11.4 cm. Echogenicity within normal limits. No mass or hydronephrosis visualized.  Bladder:  Debris noted within the urinary bladder.  Other:  Small left pleural effusion noted.  IMPRESSION: 1. No evidence of hydronephrosis to explain patient's acute on chronic renal failure. 2. Focal hypoechoic mass within the mid right kidney corresponding with previously noted suspected lymphoma involvement.   Electronically Signed   By: Kerby Moors M.D.   On: 04/18/2015 11:45   US Venous Img Lower Bilateral  04/18/2015   CLINICAL DATA:  Bilateral lower extremity swelling.  EXAM: BILATERAL LOWER EXTREMITY VENOUS DOPPLER ULTRASOUND  TECHNIQUE: Gray-scale sonography with graded compression, as well as color Doppler and duplex ultrasound were performed to evaluate the lower extremity deep venous systems from the level of the common femoral vein and including the common femoral, femoral, profunda femoral, popliteal and calf veins including the posterior tibial, peroneal and gastrocnemius veins when visible. The superficial great saphenous vein was also interrogated. Spectral Doppler was utilized to evaluate flow at rest and with distal augmentation maneuvers in the common femoral, femoral and popliteal  veins.  COMPARISON:  None.  FINDINGS: RIGHT LOWER EXTREMITY  Common Femoral Vein: No evidence of thrombus. Normal compressibility, respiratory phasicity and response to augmentation.  Saphenofemoral Junction: No evidence of thrombus. Normal compressibility and flow on color Doppler imaging.  Profunda Femoral Vein: No evidence of thrombus. Normal compressibility and flow on color Doppler imaging.  Femoral Vein: No evidence of thrombus. Normal compressibility, respiratory phasicity and response to augmentation.  Popliteal Vein: No evidence of  thrombus. Normal compressibility, respiratory phasicity and response to augmentation.  Calf Veins: No evidence of thrombus. Normal compressibility and flow on color Doppler imaging.  Superficial Great Saphenous Vein: No evidence of thrombus. Normal compressibility and flow on color Doppler imaging.  Venous Reflux:  None.  Other Findings:  None.  LEFT LOWER EXTREMITY  Common Femoral Vein: No evidence of thrombus. Normal compressibility, respiratory phasicity and response to augmentation.  Saphenofemoral Junction: No evidence of thrombus. Normal compressibility and flow on color Doppler imaging.  Profunda Femoral Vein: No evidence of thrombus. Normal compressibility and flow on color Doppler imaging.  Femoral Vein: No evidence of thrombus. Normal compressibility, respiratory phasicity and response to augmentation.  Popliteal Vein: No evidence of thrombus. Normal compressibility, respiratory phasicity and response to augmentation.  Calf Veins: Visualization was limited due to swelling. No definite abnormality seen.  Superficial Great Saphenous Vein: No evidence of thrombus. Normal compressibility and flow on color Doppler imaging.  Venous Reflux:  None.  Other Findings:  None.  IMPRESSION: No evidence of deep venous thrombosis seen in either lower extremity.   Electronically Signed   By: Marijo Conception, M.D.   On: 04/18/2015 11:48   Dg Chest Port 1 View  04/17/2015   CLINICAL DATA:  Leukocytosis.  Anemia.  Lymphoma.  EXAM: PORTABLE CHEST 1 VIEW  COMPARISON:  01/27/2015  FINDINGS: The heart size and mediastinal contours are within normal limits. No evidence of pulmonary consolidation or edema. Old left rib fracture deformities and mild left pleural thickening again noted.  IMPRESSION: No active disease.   Electronically Signed   By: Earle Gell M.D.   On: 04/17/2015 18:37   Medications: I have reviewed the patient's current medications. Scheduled Meds: . perphenazine  2 mg Oral BID   And  . amitriptyline  25  mg Oral BID  . cefTRIAXone (ROCEPHIN)  IV  1 g Intravenous Q24H  . cholecalciferol  1,000 Units Oral Daily  . docusate sodium  300 mg Oral BH-q7a  . insulin aspart  0-5 Units Subcutaneous QHS  . insulin aspart  0-9 Units Subcutaneous TID WC  . loratadine  10 mg Oral Daily  . metoprolol succinate  25 mg Oral Daily  . pantoprazole (PROTONIX) IV  40 mg Intravenous Q12H  . pravastatin  80 mg Oral q1800   Continuous Infusions: . sodium chloride 50 mL/hr at 04/19/15 0930   PRN Meds:.acetaminophen **OR** acetaminophen, ondansetron **OR** ondansetron (ZOFRAN) IV, traMADol-acetaminophen Assessment: Active Problems:   Anemia   Blood in stool    Plan: Patient with anemia likely from lymphoma and a some GI bleeding. Patient to continue with transfusion and monitor Hb. No plan to do any procedures as per patient and family until patient stronger.   LOS: 2 days   Anna Leonard 04/19/2015, 12:02 PM

## 2015-04-19 NOTE — Progress Notes (Addendum)
Palliative Care Update   I note that Dr. Grayland Ormond has spoken with family about Hospice Home and this is being worked on. There does not seem to be any resistance to this plan, so counseling about choices is not needed at this time by Palliative Care (myself).    I can assist with this process by working on discharge medications in anticipation of transfer to [facility] Saturday.    Colleen Can, MD

## 2015-04-19 NOTE — Care Management (Signed)
Dr. Grayland Ormond discussed [place] with Ms. Zaro and family members.  Agreement with transfer to [place]. Update called to [place] representative for [facility]. Possible discharge to [place] per Doreatha Lew, RN. Update called to Dr. Darvin Neighbours. Shelbie Ammons RN MSN Care Management [phone]

## 2015-04-19 NOTE — Progress Notes (Signed)
Inpatient Diabetes Program Recommendations  AACE/ADA: New Consensus Statement on Inpatient Glycemic Control (2015)  Target Ranges:  Prepandial:   less than 140 mg/dL      Peak postprandial:   less than 180 mg/dL (1-2 hours)      Critically ill patients:  140 - 180 mg/dL   Review of Glycemic Control  Results for ARLA, BOUTWELL (MRN [id number]) as of 04/19/2015 11:47  Ref. Range 04/18/2015 22:10 04/19/2015 05:26 04/19/2015 05:50 04/19/2015 07:24 04/19/2015 11:28  Glucose-Capillary Latest Ref Range: 65-99 mg/dL 88 56 (L) 89 75 93   Diabetes history: Type 2 Outpatient Diabetes medications: Glucotrol 2.5mg  qday, Metformin 500mg  qam Current orders for Inpatient glycemic control: Novolog 0-9 units tid, Novolog 0-5 units qhs,  Inpatient Diabetes Program Recommendations: Consider stopping the Novolog 3 units tid with meals- continue Novolog correction (SSI)  Gentry Fitz, RN, BA, [place], CDE Diabetes Coordinator Inpatient Diabetes Program  [phone] (Team Pager) [phone] ([place]) 04/19/2015 11:44 AM

## 2015-04-20 LAB — CBC WITH DIFFERENTIAL/PLATELET
BAND NEUTROPHILS: 1 %
BASOS ABS: 0 10*3/uL (ref 0–0.1)
BASOS PCT: 0 %
Blasts: 0 %
EOS ABS: 0 10*3/uL (ref 0–0.7)
EOS PCT: 0 %
HEMATOCRIT: 24 % — AB (ref 35.0–47.0)
Hemoglobin: 8.1 g/dL — ABNORMAL LOW (ref 12.0–16.0)
LYMPHS ABS: 27.5 10*3/uL — AB (ref 1.0–3.6)
Lymphocytes Relative: 78 %
MCH: 27.4 pg (ref 26.0–34.0)
MCHC: 33.6 g/dL (ref 32.0–36.0)
MCV: 81.7 fL (ref 80.0–100.0)
METAMYELOCYTES PCT: 0 %
MONO ABS: 0 10*3/uL — AB (ref 0.2–0.9)
MONOS PCT: 0 %
MYELOCYTES: 0 %
NEUTROS ABS: 1.1 10*3/uL — AB (ref 1.4–6.5)
Neutrophils Relative %: 2 %
Other: 19 %
PLATELETS: 20 10*3/uL — AB (ref 150–440)
Promyelocytes Absolute: 0 %
RBC: 2.94 MIL/uL — ABNORMAL LOW (ref 3.80–5.20)
RDW: 15.2 % — AB (ref 11.5–14.5)
Smear Review: DECREASED
WBC: 35.2 10*3/uL — ABNORMAL HIGH (ref 3.6–11.0)
nRBC: 0 /100 WBC

## 2015-04-20 LAB — GLUCOSE, CAPILLARY
GLUCOSE-CAPILLARY: 120 mg/dL — AB (ref 65–99)
Glucose-Capillary: 82 mg/dL (ref 65–99)

## 2015-04-20 LAB — BASIC METABOLIC PANEL
Anion gap: 7 (ref 5–15)
BUN: 36 mg/dL — ABNORMAL HIGH (ref 6–20)
CALCIUM: 8.5 mg/dL — AB (ref 8.9–10.3)
CO2: 23 mmol/L (ref 22–32)
CREATININE: 1.67 mg/dL — AB (ref 0.44–1.00)
Chloride: 105 mmol/L (ref 101–111)
GFR, EST AFRICAN AMERICAN: 33 mL/min — AB (ref >60)
GFR, EST NON AFRICAN AMERICAN: 28 mL/min — AB (ref >60)
Glucose, Bld: 76 mg/dL (ref 65–99)
Potassium: 4 mmol/L (ref 3.5–5.1)
SODIUM: 135 mmol/L (ref 135–145)

## 2015-04-20 MED ORDER — SODIUM CHLORIDE 0.9 % IJ SOLN
3.0000 mL | Freq: Two times a day (BID) | INTRAMUSCULAR | Status: DC
  Administered 2015-04-20 (×2): 3 mL via INTRAVENOUS

## 2015-04-20 NOTE — Progress Notes (Signed)
Discharged to [facility].

## 2015-04-20 NOTE — Progress Notes (Signed)
TC to [employer] at [facility] with report given; pt accepted. TC message relayed that family requested not to tell pt she was being discharged to hospice home. EMS called. Pt prepared for transport/discharge. Comfort measures performed.

## 2015-04-20 NOTE — Discharge Summary (Signed)
[place] at [place] NAME: Anna Leonard    MR#:  [id number]  DATE OF BIRTH:  02/25/1936  DATE OF ADMISSION:  04/17/2015 ADMITTING PHYSICIAN: Theodoro Grist, MD  DATE OF DISCHARGE: No discharge date for patient encounter.  PRIMARY CARE PHYSICIAN: FELDPAUSCH, DALE E, MD    ADMISSION DIAGNOSIS:  Hyperkalemia [E87.5] Swelling [R60.9] Leukocytosis [D72.829] Thrombocytopenia (HCC) [D69.6] Acute renal failure, unspecified acute renal failure type ([place]) [N17.9]  DISCHARGE DIAGNOSIS:  Active Problems:   Anemia   Blood in stool   SECONDARY DIAGNOSIS:   Past Medical History  Diagnosis Date  . Hypertension   . Allergy   . Type 2 diabetes mellitus   . HLD (hyperlipidemia)   . Anxiety   . Paralysis     left arm  . Cancer     lymphoma- chemo  . Bell palsy   . Mantle cell lymphoma      ADMITTING HISTORY  HISTORY OF PRESENT ILLNESS: Anna Leonard is a 79 y.o. female with a known history of mantle cell lymphoma with recent CT scan revealing recurrence of lymphoma presents back to the hospital with severe weakness, left extremity swelling and confusion. According to patient's daughter, as patient herself is not able to provide history due to medical condition. Patient was admitted on December on 03/24/2015 for urinary tract infection. She was discharged to [facility]. She did relatively well for the 2 weeks. However, over the past few days she's been having problems with increasing confusion, generalized weakness and lower extremity swelling. Labs were taken and patient was noted to have acute on chronic renal failure, hyperkalemia, hyponatremia, elevated white blood cell count 52,000 and severe anemia with hemoglobin level of 5.8. Patient was with strongly heme occult positive on rectal exam in the emergency room. Hospitalist services were contacted for admission   HOSPITAL COURSE:    * Acute blood loss anemia over chronic  anemia * ARF over CKD3 - Mild hyperkalemia * Thrombocytopenia * UTI * Mantle cell lymphoma * urinary retention likely from UTI * LE pain. No DVT on dopplers.  Transfused PRBC and Platelets initially. Patient did not want any aggressive procedures like endocopy. On IV abx for UTI.  Due to poor prognosis with aggressive mantle cell lymphoma Dr. Grayland Ormond has discussed with patient and family. Now plan is to discharge patient to hospice home for end of life care.  Regular diet, activity as tolerated.  Continue foley   CONSULTS OBTAINED:  Treatment Team:  Lucilla Lame, MD Lloyd Huger, MD  DRUG ALLERGIES:   Allergies  Allergen Reactions  . Amoxicillin-Pot Clavulanate Other (See Comments)    diarrhea    DISCHARGE MEDICATIONS:   Current Discharge Medication List    START taking these medications   Details  acetaminophen (TYLENOL) 325 MG tablet Take 2 tablets (650 mg total) by mouth every 4 (four) hours as needed for mild pain, fever or headache.    bisacodyl (DULCOLAX) 10 MG suppository Place 1 suppository (10 mg total) rectally as needed for moderate constipation. Qty: 4 suppository, Refills: 0    glycopyrrolate (ROBINUL) 1 MG tablet Take 1 tablet (1 mg total) by mouth 3 (three) times daily as needed. Qty: 12 tablet, Refills: 0    LORazepam (ATIVAN) 0.5 MG tablet Take 1 tablet (0.5 mg total) by mouth every 4 (four) hours as needed for anxiety. Qty: 15 tablet, Refills: 0    Morphine Sulfate (MORPHINE CONCENTRATE) 10 mg / 0.5 ml concentrated solution  Take 0.25 mLs (5 mg total) by mouth every 2 (two) hours as needed (pain or shortness of breath). Qty: 30 mL, Refills: 0    prochlorperazine (COMPAZINE) 25 MG suppository Place 1 suppository (25 mg total) rectally every 12 (twelve) hours as needed for nausea or vomiting. Qty: 6 suppository, Refills: 0      STOP taking these medications     aspirin 81 MG chewable tablet      B Complex-C (B-COMPLEX WITH VITAMIN C)  tablet      Cholecalciferol 1000 UNITS capsule      docusate sodium (COLACE) 100 MG capsule      glipiZIDE (GLUCOTROL XL) 2.5 MG 24 hr tablet      lisinopril-hydrochlorothiazide (PRINZIDE,ZESTORETIC) 20-25 MG per tablet      loratadine (CLARITIN) 10 MG tablet      lovastatin (MEVACOR) 40 MG tablet      metFORMIN (GLUCOPHAGE) 500 MG tablet      perphenazine-amitriptyline (ETRAFON/TRIAVIL) 2-25 MG TABS          Today    VITAL SIGNS:  Blood pressure 108/54, pulse 104, temperature 98.4 F (36.9 C), temperature source Oral, resp. rate 17, height 5\' 2"  (1.575 m), weight 65.772 kg (145 lb), SpO2 96 %.  I/O:   Intake/Output Summary (Last 24 hours) at 04/20/15 0825 Last data filed at 04/19/15 2147  Gross per 24 hour  Intake    854 ml  Output   1050 ml  Net   -196 ml    PHYSICAL EXAMINATION:  Physical Exam  GENERAL:  78 y.o.-year-old patient lying in the bed with no acute distress.  LUNGS: Normal breath sounds bilaterally CARDIOVASCULAR: S1, S2  ABDOMEN: Soft, non-tender NEUROLOGIC: Moves all 4 extremities. PSYCHIATRIC: The patient is awake but confused SKIN: No obvious rash 3+ LE edema Foley  DATA REVIEW:   CBC  Recent Labs Lab 04/20/15 0436  WBC 35.2*  HGB 8.1*  HCT 24.0*  PLT 20*    Chemistries   Recent Labs Lab 04/17/15 0957  04/20/15 0436  NA 130*  < > 135  K 5.9*  < > 4.0  CL 96*  < > 105  CO2 25  < > 23  GLUCOSE 169*  < > 76  BUN 63*  < > 36*  CREATININE 2.29*  < > 1.67*  CALCIUM 9.3  < > 8.5*  AST 25  --   --   ALT 16  --   --   ALKPHOS 225*  --   --   BILITOT 0.5  --   --   < > = values in this interval not displayed.  Cardiac Enzymes  Recent Labs Lab 04/17/15 0957  TROPONINI <0.03    Microbiology Results  Results for orders placed or performed during the hospital encounter of 04/17/15  Blood culture (routine x 2)     Status: None (Preliminary result)   Collection Time: 04/17/15 12:29 PM  Result Value Ref Range Status    Specimen Description BLOOD RIGHT ARM  Final   Special Requests   Final    BOTTLES DRAWN AEROBIC AND ANAEROBIC 2CC AEROBIC,2CC ANAEROBIC   Culture NO GROWTH < 24 HOURS  Final   Report Status PENDING  Incomplete  Urine culture     Status: None   Collection Time: 04/17/15 12:29 PM  Result Value Ref Range Status   Specimen Description URINE, CLEAN CATCH  Final   Special Requests Immunocompromised  Final   Culture MULTIPLE SPECIES PRESENT, SUGGEST RECOLLECTION  Final   Report Status 04/19/2015 FINAL  Final  Blood culture (routine x 2)     Status: None (Preliminary result)   Collection Time: 04/17/15 12:46 PM  Result Value Ref Range Status   Specimen Description BLOOD RIGHT WRIST  Final   Special Requests   Final    BOTTLES DRAWN AEROBIC AND ANAEROBIC 2CC AEROBIC,2CCANAEROBIC   Culture NO GROWTH < 24 HOURS  Final   Report Status PENDING  Incomplete    RADIOLOGY:  US Renal  04/18/2015   CLINICAL DATA:  Acute on chronic renal failure.  EXAM: RENAL / URINARY TRACT ULTRASOUND COMPLETE  COMPARISON:  03/24/2015  FINDINGS: Right Kidney:  Length: 10.4 cm. Focal hypoechoic mass within the midpole measures 1.8 x 1.6 x 1.8 cm. No hydronephrosis identified.  Left Kidney:  Length: 11.4 cm. Echogenicity within normal limits. No mass or hydronephrosis visualized.  Bladder:  Debris noted within the urinary bladder.  Other:  Small left pleural effusion noted.  IMPRESSION: 1. No evidence of hydronephrosis to explain patient's acute on chronic renal failure. 2. Focal hypoechoic mass within the mid right kidney corresponding with previously noted suspected lymphoma involvement.   Electronically Signed   By: Kerby Moors M.D.   On: 04/18/2015 11:45   US Venous Img Lower Bilateral  04/18/2015   CLINICAL DATA:  Bilateral lower extremity swelling.  EXAM: BILATERAL LOWER EXTREMITY VENOUS DOPPLER ULTRASOUND  TECHNIQUE: Gray-scale sonography with graded compression, as well as color Doppler and duplex ultrasound were  performed to evaluate the lower extremity deep venous systems from the level of the common femoral vein and including the common femoral, femoral, profunda femoral, popliteal and calf veins including the posterior tibial, peroneal and gastrocnemius veins when visible. The superficial great saphenous vein was also interrogated. Spectral Doppler was utilized to evaluate flow at rest and with distal augmentation maneuvers in the common femoral, femoral and popliteal veins.  COMPARISON:  None.  FINDINGS: RIGHT LOWER EXTREMITY  Common Femoral Vein: No evidence of thrombus. Normal compressibility, respiratory phasicity and response to augmentation.  Saphenofemoral Junction: No evidence of thrombus. Normal compressibility and flow on color Doppler imaging.  Profunda Femoral Vein: No evidence of thrombus. Normal compressibility and flow on color Doppler imaging.  Femoral Vein: No evidence of thrombus. Normal compressibility, respiratory phasicity and response to augmentation.  Popliteal Vein: No evidence of thrombus. Normal compressibility, respiratory phasicity and response to augmentation.  Calf Veins: No evidence of thrombus. Normal compressibility and flow on color Doppler imaging.  Superficial Great Saphenous Vein: No evidence of thrombus. Normal compressibility and flow on color Doppler imaging.  Venous Reflux:  None.  Other Findings:  None.  LEFT LOWER EXTREMITY  Common Femoral Vein: No evidence of thrombus. Normal compressibility, respiratory phasicity and response to augmentation.  Saphenofemoral Junction: No evidence of thrombus. Normal compressibility and flow on color Doppler imaging.  Profunda Femoral Vein: No evidence of thrombus. Normal compressibility and flow on color Doppler imaging.  Femoral Vein: No evidence of thrombus. Normal compressibility, respiratory phasicity and response to augmentation.  Popliteal Vein: No evidence of thrombus. Normal compressibility, respiratory phasicity and response to  augmentation.  Calf Veins: Visualization was limited due to swelling. No definite abnormality seen.  Superficial Great Saphenous Vein: No evidence of thrombus. Normal compressibility and flow on color Doppler imaging.  Venous Reflux:  None.  Other Findings:  None.  IMPRESSION: No evidence of deep venous thrombosis seen in either lower extremity.   Electronically Signed   By: Marijo Conception,  M.D.   On: 04/18/2015 11:48      Follow up with PCP in 1 week.  Management plans discussed with the patient, family and they are in agreement.  CODE STATUS:     Code Status Orders        Start     Ordered   04/19/15 1547  Do not attempt resuscitation (DNR)   Continuous    Question Answer Comment  In the event of cardiac or respiratory ARREST Do not call a "code blue"   In the event of cardiac or respiratory ARREST Do not perform Intubation, CPR, defibrillation or ACLS   In the event of cardiac or respiratory ARREST Use medication by any route, position, wound care, and other measures to relive pain and suffering. May use oxygen, suction and manual treatment of airway obstruction as needed for comfort.      04/19/15 1546      TOTAL TIME TAKING CARE OF THIS PATIENT ON DAY OF DISCHARGE: more than 30 minutes.    Hillary Bow R M.D on 04/20/2015 at 8:25 AM  Between 7am to 6pm - Pager - [phone]  After 6pm go to [email or web address] - password [email or web address] [place] [facility]  Office  [phone]  CC: Primary care physician; [facility], Chrissie Noa, MD     Note: This dictation was prepared with Dragon dictation along with smaller phrase technology. Any transcriptional errors that result from this process are unintentional.

## 2015-04-20 NOTE — Plan of Care (Signed)
Problem: Discharge Progression Outcomes Goal: Other Discharge Outcomes/Goals Outcome: Progressing Plan of Care Progress to Goal:   Pt report pain once during shift. Pt has been resting comfortably during shift. Pt platelet is 20, MD is aware. HR elevated. No other signs of distress noted. Will continue to monitor.

## 2015-04-20 NOTE — Progress Notes (Signed)
Paged and spoke with Dr. Reece Levy about pt platelets of 20. Per Dr. Reece Levy, pass it on to the rounding MD since pt is stable at the moment.

## 2015-04-20 NOTE — Progress Notes (Signed)
CSW was notified by [facility] that a bed is available for Pt today. MD cleared by for DC. CSW faxed updated clinicals and dc summary to hospice. CSW spoke to Pt's daughter Freda Munro and she is agreeable for transfer today via ems. Pt's family will meet her at hospice home.   Toma Copier, [place]

## 2015-04-20 NOTE — Progress Notes (Signed)
Pt medicated for pain prior to transport. EMS here to transport pt to [facility]. TC to [employer] at [phone] to notify here that pt is being discharged now and states she will go to [facility] later today. Advised she will come here and pick up pt flowers/ballons at nurses station.

## 2015-04-22 LAB — CULTURE, BLOOD (ROUTINE X 2)
CULTURE: NO GROWTH
CULTURE: NO GROWTH

## 2015-04-23 ENCOUNTER — Inpatient Hospital Stay: Payer: Commercial Managed Care - HMO | Attending: Oncology

## 2015-04-23 ENCOUNTER — Ambulatory Visit: Admission: RE | Admit: 2015-04-23 | Payer: Commercial Managed Care - HMO | Source: Ambulatory Visit

## 2015-04-30 ENCOUNTER — Inpatient Hospital Stay: Payer: Commercial Managed Care - HMO | Admitting: Oncology

## 2015-05-21 DEATH — deceased

## 2015-12-29 IMAGING — CT CT ABD-PELV W/ CM
1 of 3 series · 13 of 32 positions shown, 18 images · IV contrast (omnipaque)
Comparison: PET-CT 10/15/2014

CLINICAL DATA: Fever, weakness, burning with urination for 2-3
weeks. History of lymphoma.

EXAM:
CT ABDOMEN AND PELVIS WITH CONTRAST
TECHNIQUE: Multidetector CT imaging of the abdomen and pelvis was performed
using the standard protocol following bolus administration of
intravenous contrast.
CONTRAST:  100mL OMNIPAQUE IOHEXOL 300 MG/ML  SOLN

[Series 2: routine abd pel with · axial · 0.72mm/px · z∈[-446,-36]mm · 13 of 94 slices shown, 18 images]
[im 6/94  soft-tissue]
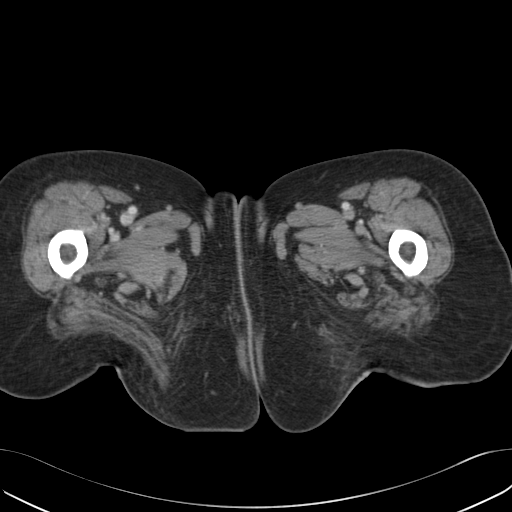
[im 6/94  bone]
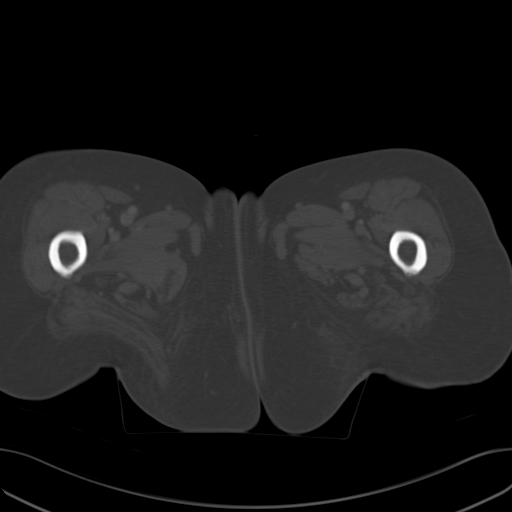
[im 17/94  soft-tissue]
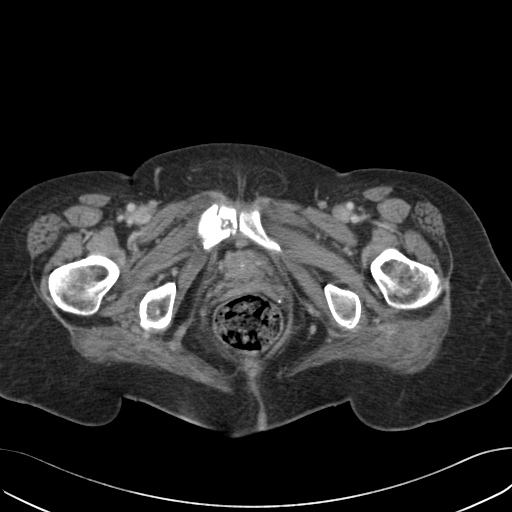
[im 22/94  soft-tissue]
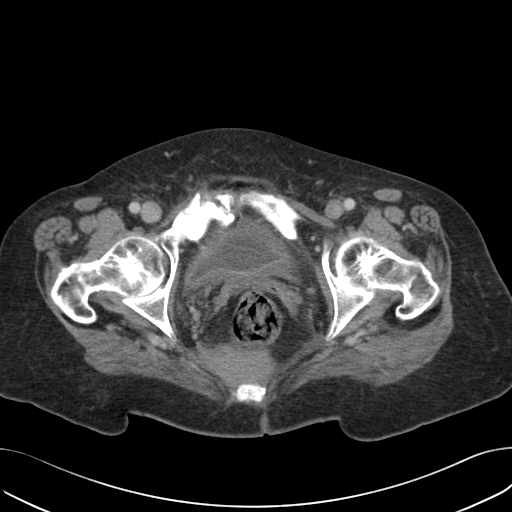
[im 28/94  soft-tissue]
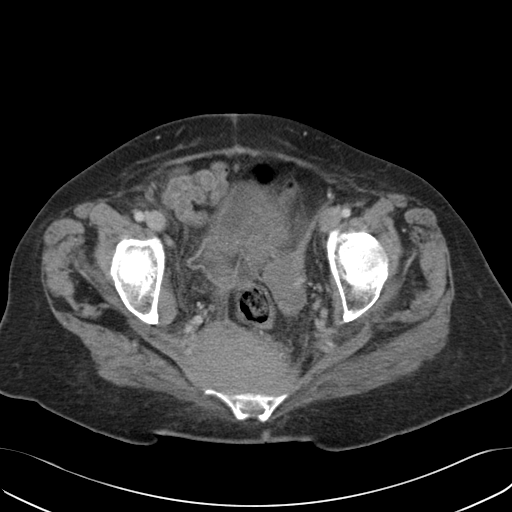
[im 39/94  soft-tissue]
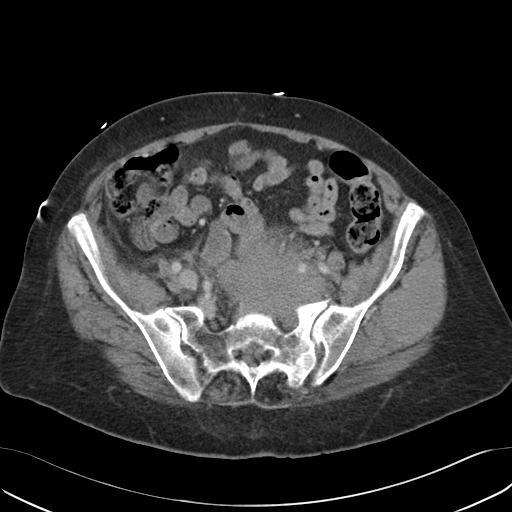
[im 44/94  soft-tissue]
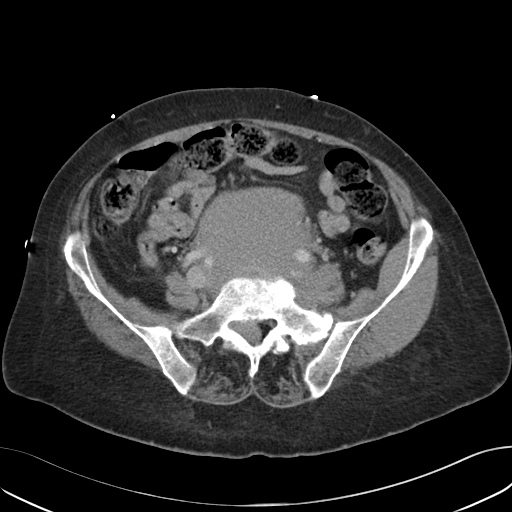
[im 50/94  soft-tissue]
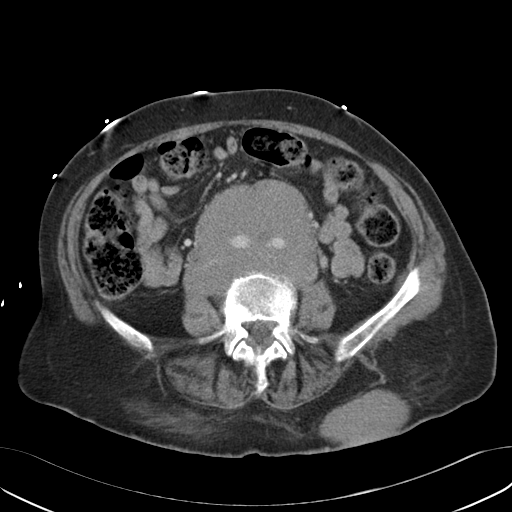
[im 61/94  soft-tissue]
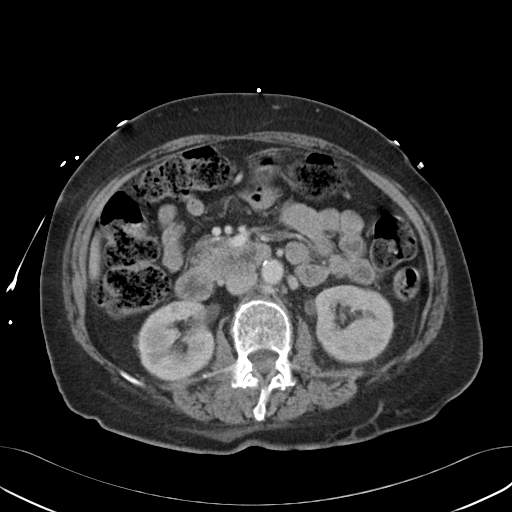
[im 66/94  soft-tissue]
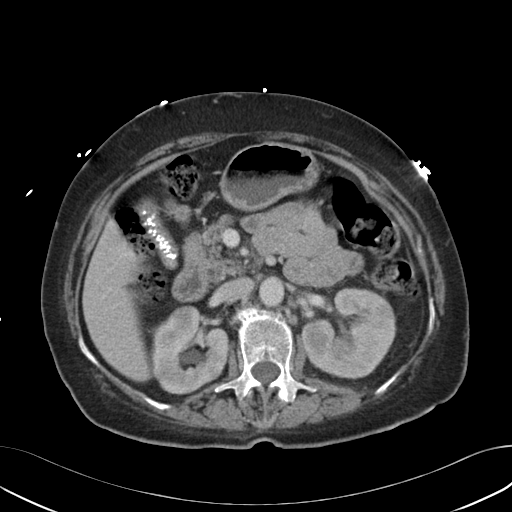
[im 66/94  bone]
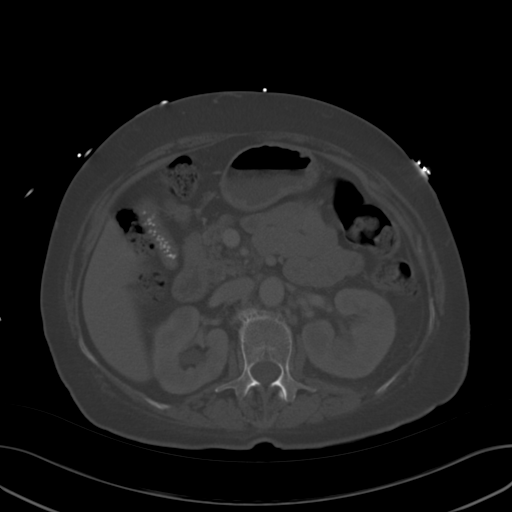
[im 72/94  soft-tissue]
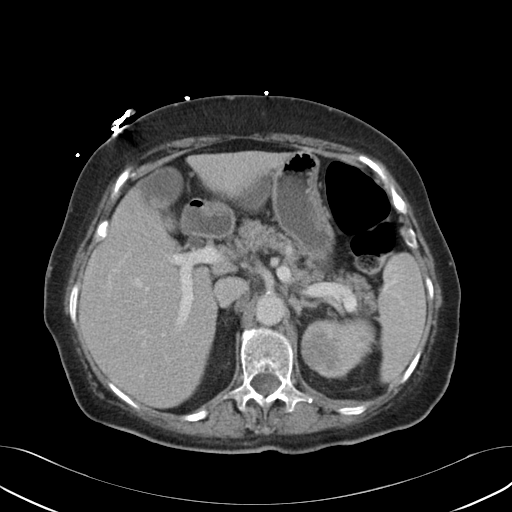
[im 72/94  lung]
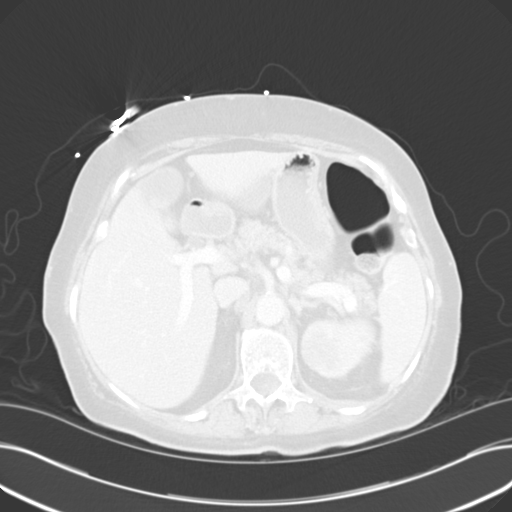
[im 77/94  lung]
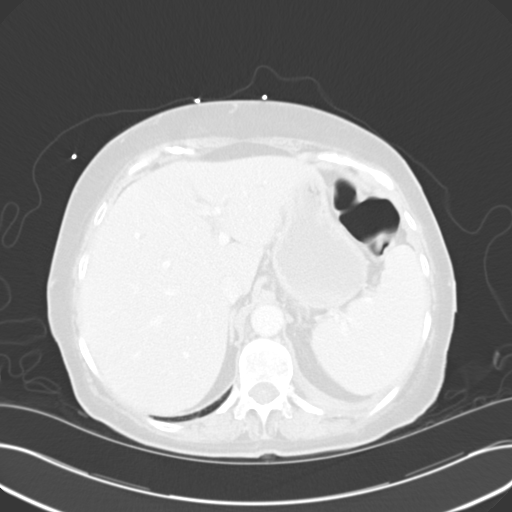
[im 83/94  soft-tissue]
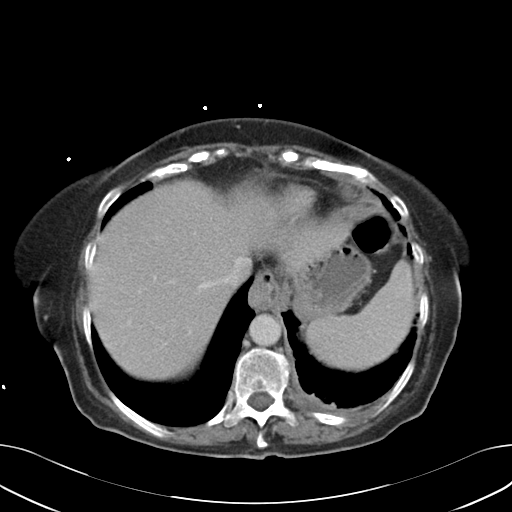
[im 83/94  lung]
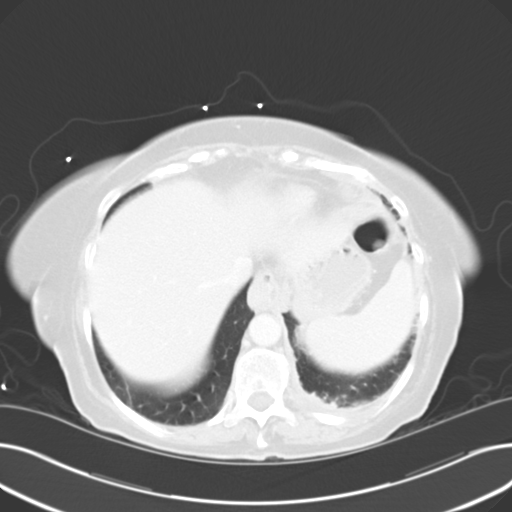
[im 88/94  soft-tissue]
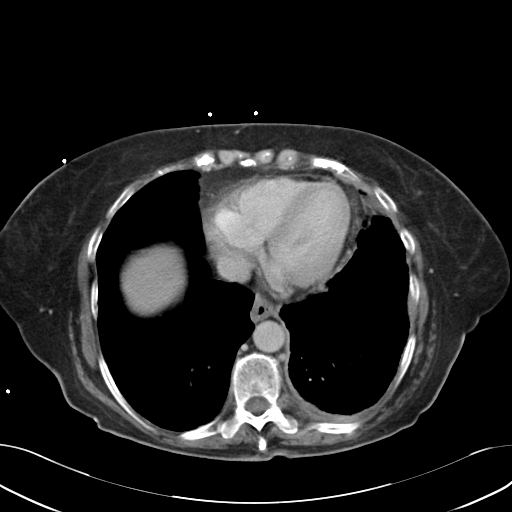
[im 88/94  lung]
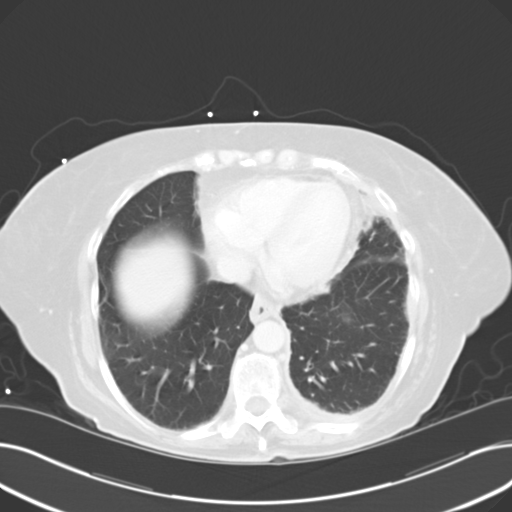

[13 of 32 positions shown; findings below may reference images not displayed]

FINDINGS: Lower chest: Mild chronic interstitial disease at the left lung
base. No pleural effusion. Normal heart size.

Hepatobiliary: Normal liver. Gallbladder filled with cholelithiasis.
Gallbladder is otherwise normal.

Pancreas: Normal pancreas.

Spleen: Normal spleen.

Adrenals/Urinary Tract: Normal adrenal glands. No obstructive
uropathy. No nephrolithiasis. There are numerous hypodense masses
throughout bilateral kidneys with the largest in the right kidney
measuring 2.4 cm and the largest in the left kidney measuring
cm. There is circumferential bladder wall thickening.

Stomach/Bowel: No bowel wall thickening or dilatation. No bowel
obstruction. No pneumatosis, pneumoperitoneum or portal venous gas.
No abdominal pelvic free fluid. There is a small fat containing
umbilical hernia.

Vascular/Lymphatic: Normal caliber abdominal aorta. Large
retroperitoneal soft tissue mass measuring approximately 9.1 x 6.7 x
11.2 cm encasing the distal portion of the abdominal aorta and
common iliac arteries as well as the IVC. There is a large presacral
soft tissue mass measuring 8.3 x 5.3 cm encasing the right S3 nerve
root. There is a left cardiophrenic mass measuring 1.3 x 3.4 cm most
consistent with an enlarged lymph node.

Reproductive: Prior hysterectomy.  No adnexal mass.

Other: There is a 7 x 3.9 cm soft tissue mass in the subcutaneous
fat of the left lower back.

Musculoskeletal: There is expansion of the left iliacus muscle, left
gluteus maximus muscle and left gluteus medius muscle. There is no
aggressive lytic or sclerotic osseous lesion. There is an ununited
insufficiency fracture of the right inferior pubic ramus and right
pubic symphysis. There is increased soft tissue density within the
right L5-S1 neural foramen and extending into the epidural space.
IMPRESSION: 1. The overall appearance of the abdomen/pelvis is most consistent
with severe recurrence of lymphoma as described above.
2. There are multiple hypodense masses throughout bilateral kidneys
most concerning for lymphomatous involvement.
3. There is increased soft tissue density within the right L5-S1
neural foramen and extending into the epidural space likely
reflecting lymphoma recurrence.
4. Cholelithiasis.

## 2017-04-01 IMAGING — US US RENAL
1 series · 14 of 25 positions shown · non-contrast
Comparison: 03/24/2015

CLINICAL DATA: Acute on chronic renal failure.

EXAM:
RENAL / URINARY TRACT ULTRASOUND COMPLETE

[Series 1: us renal · 0.23mm/px · 14 of 47 slices shown]
[im 1/47]
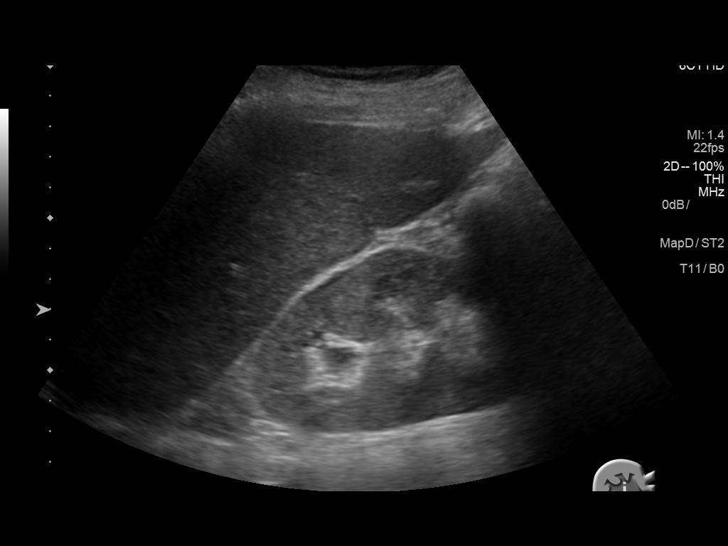
[im 4/47]
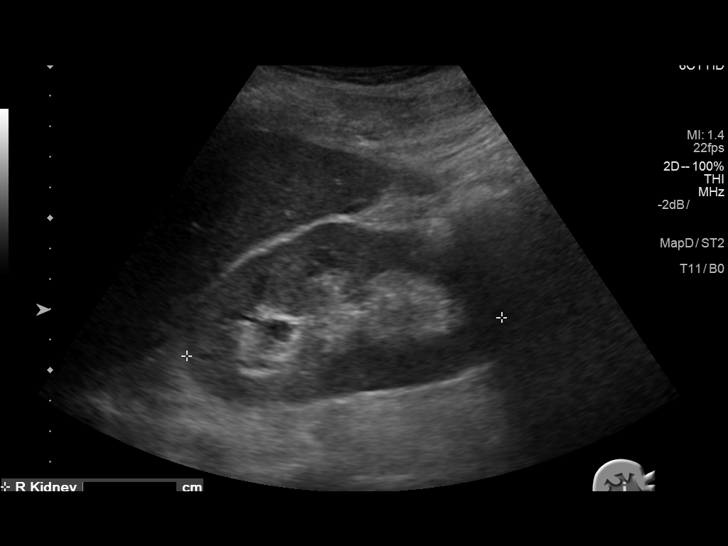
[im 8/47]
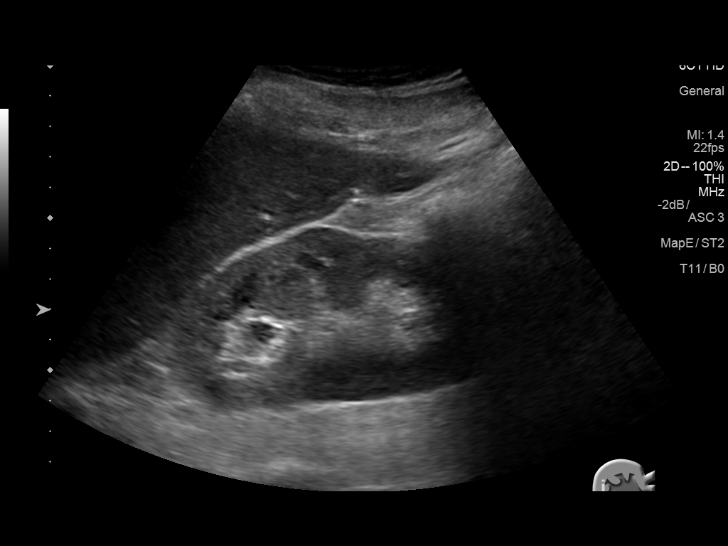
[im 12/47]
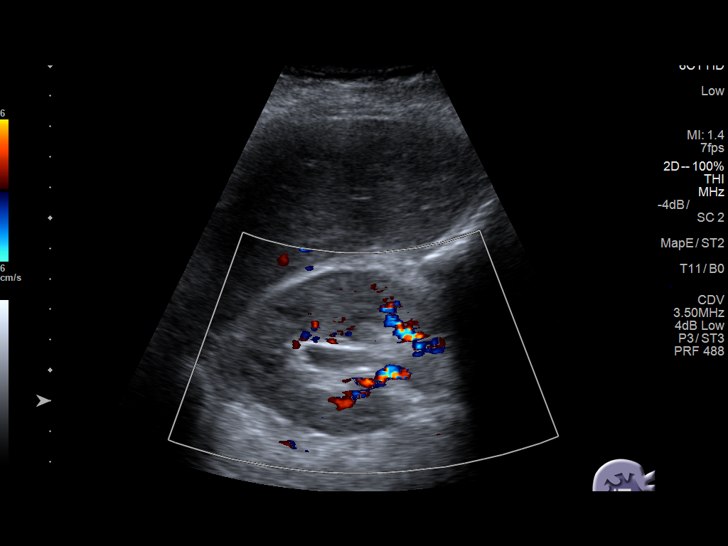
[im 16/47]
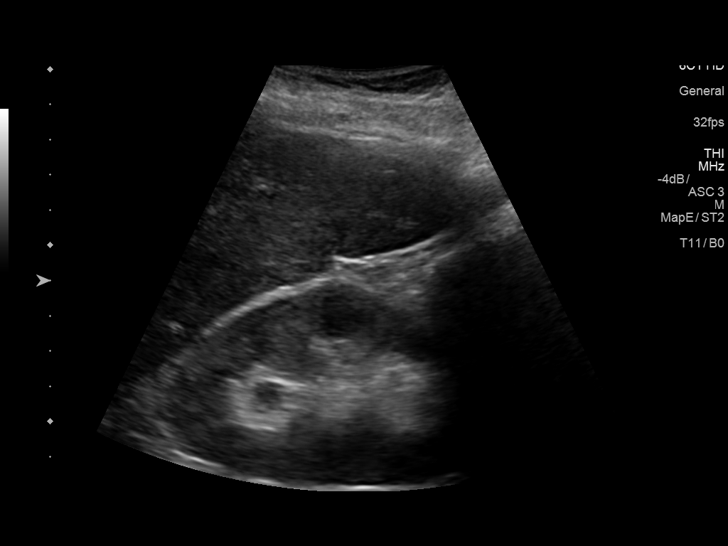
[im 18/47]
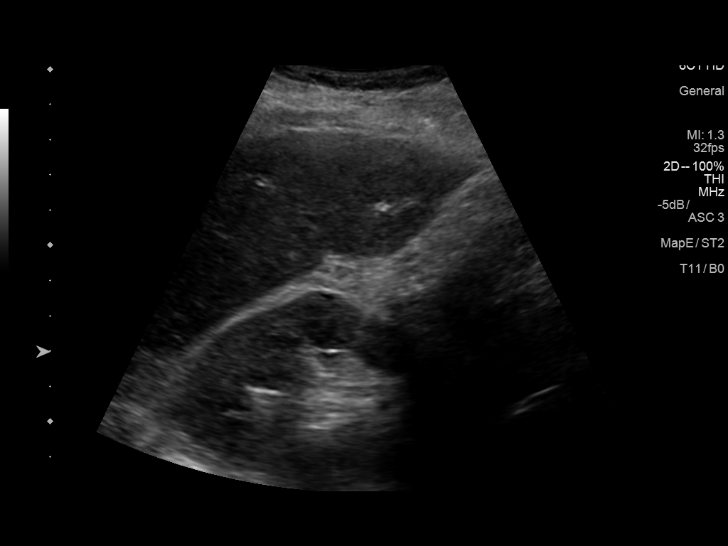
[im 22/47]
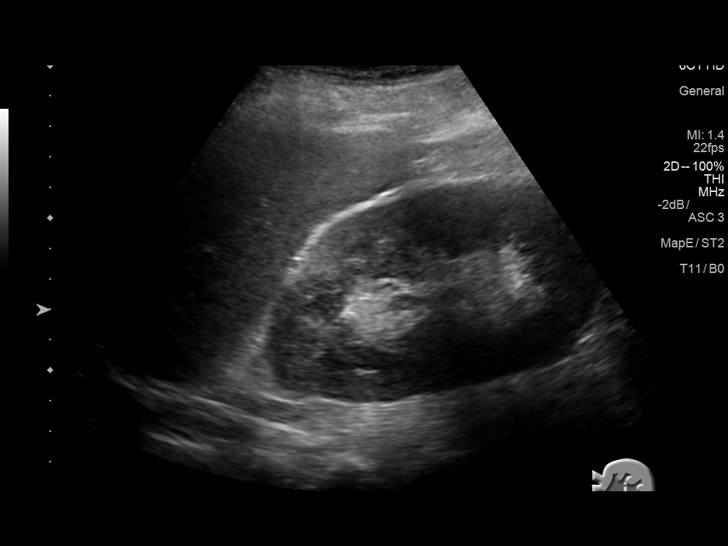
[im 25/47]
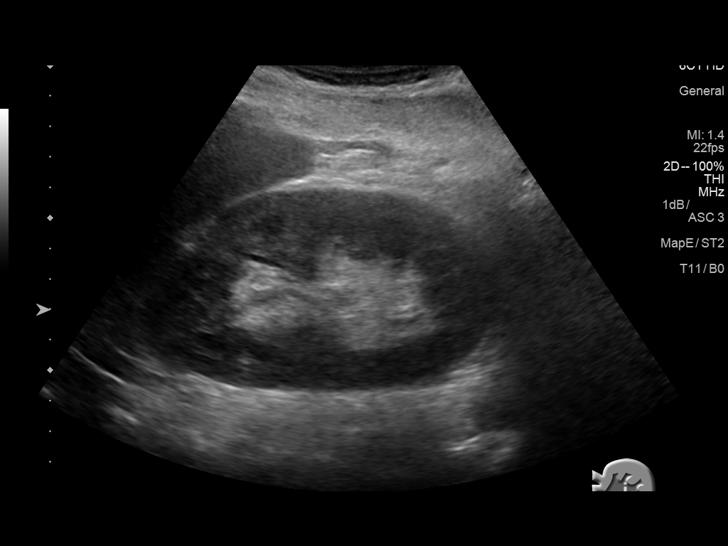
[im 29/47]
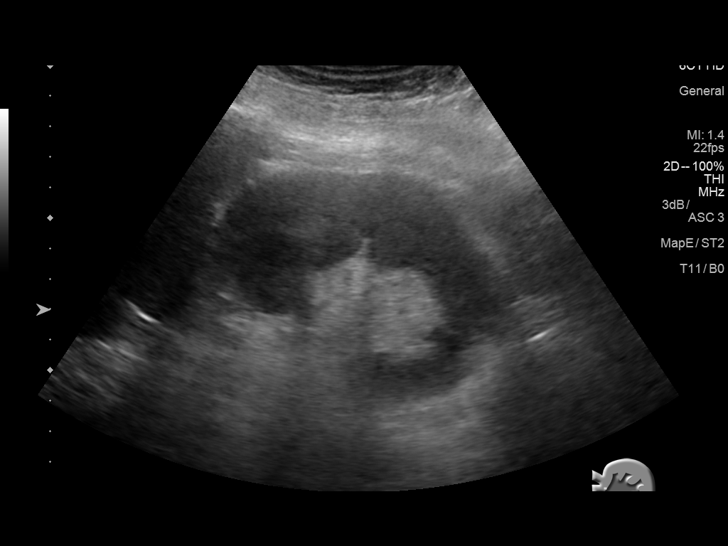
[im 31/47]
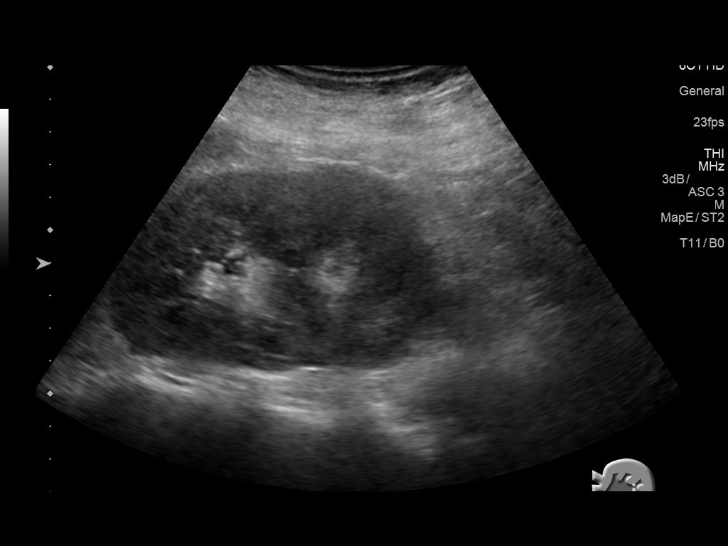
[im 35/47]
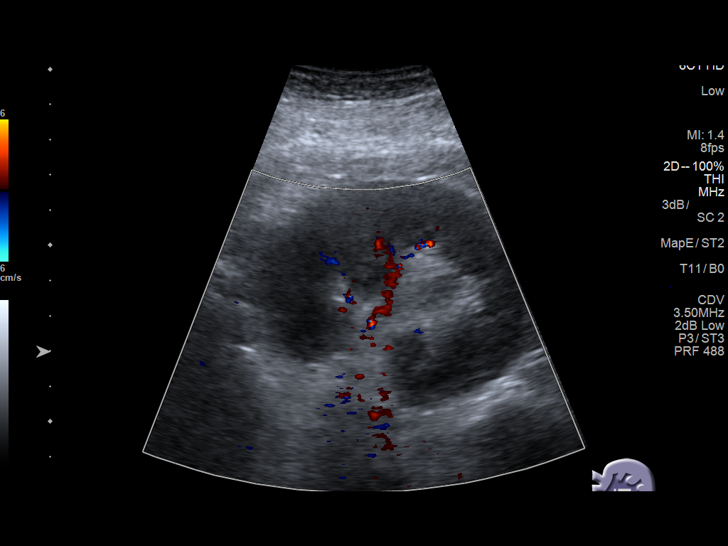
[im 39/47]
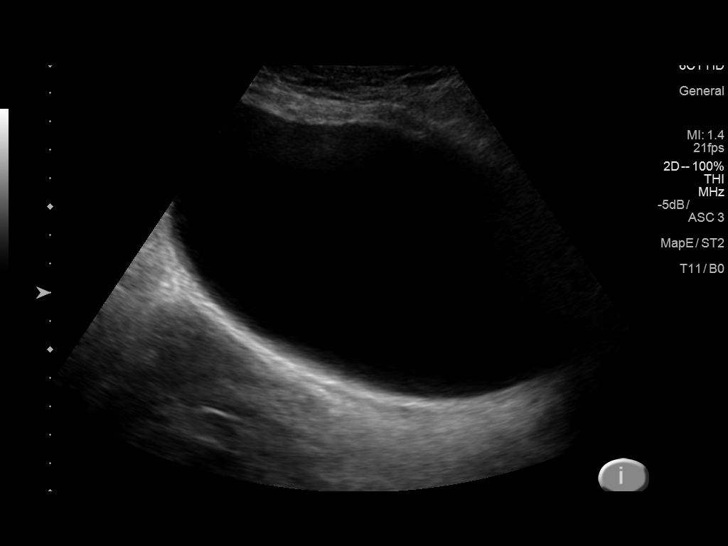
[im 43/47]
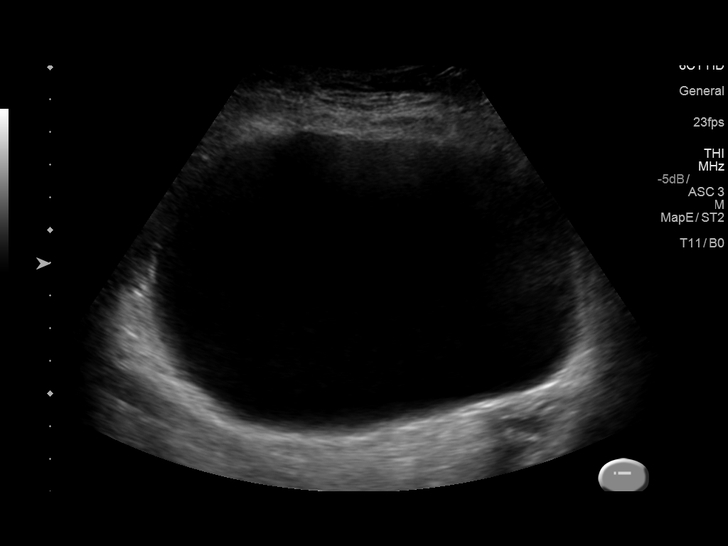
[im 47/47]
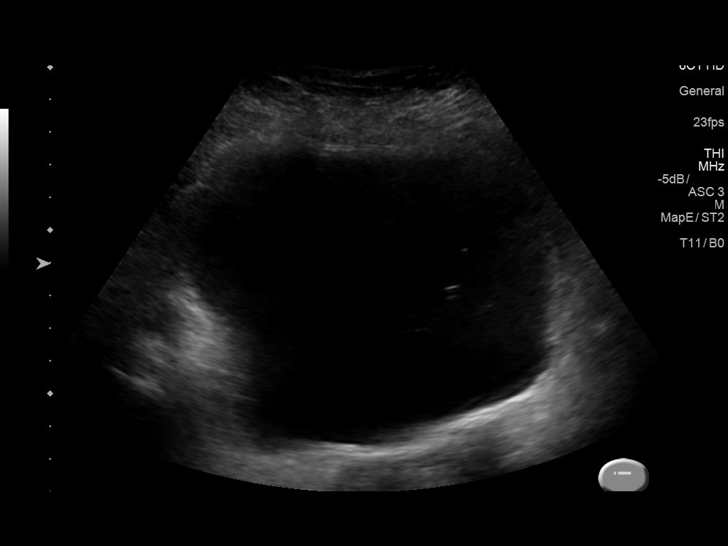

[14 of 25 positions shown; findings below may reference images not displayed]

FINDINGS: Right Kidney:

Length: 10.4 cm. Focal hypoechoic mass within the midpole measures
1.8 x 1.6 x 1.8 cm. No hydronephrosis identified.

Left Kidney:

Length: 11.4 cm.. Echogenicity within normal limits. No mass or
hydronephrosis visualized.

Bladder:

Debris noted within the urinary bladder.

Other:  Small left pleural effusion noted.
IMPRESSION: 1. No evidence of hydronephrosis to explain patient's acute on
chronic renal failure.
2. Focal hypoechoic mass within the mid right kidney corresponding
with previously noted suspected lymphoma involvement.
